# Patient Record
Sex: Male | Born: 1955 | Race: White | Hispanic: No | Marital: Married | Smoking: Former smoker
Health system: Southern US, Community
[De-identification: ages and names within clinical notes are randomized; demographics above are authoritative.]

## PROBLEM LIST (undated history)

## (undated) DIAGNOSIS — F32A Depression, unspecified: Secondary | ICD-10-CM

## (undated) DIAGNOSIS — F329 Major depressive disorder, single episode, unspecified: Secondary | ICD-10-CM

## (undated) DIAGNOSIS — Z889 Allergy status to unspecified drugs, medicaments and biological substances status: Secondary | ICD-10-CM

## (undated) DIAGNOSIS — I10 Essential (primary) hypertension: Secondary | ICD-10-CM

## (undated) DIAGNOSIS — E7800 Pure hypercholesterolemia, unspecified: Secondary | ICD-10-CM

## (undated) DIAGNOSIS — H409 Unspecified glaucoma: Secondary | ICD-10-CM

## (undated) DIAGNOSIS — G249 Dystonia, unspecified: Secondary | ICD-10-CM

## (undated) DIAGNOSIS — T7840XA Allergy, unspecified, initial encounter: Secondary | ICD-10-CM

## (undated) HISTORY — PX: COLONOSCOPY: SHX174

## (undated) HISTORY — PX: VASECTOMY: SHX75

## (undated) HISTORY — DX: Allergy, unspecified, initial encounter: T78.40XA

## (undated) HISTORY — PX: POLYPECTOMY: SHX149

## (undated) HISTORY — DX: Major depressive disorder, single episode, unspecified: F32.9

## (undated) HISTORY — DX: Dystonia, unspecified: G24.9

## (undated) HISTORY — DX: Depression, unspecified: F32.A

---

## 1998-06-02 HISTORY — PX: OTHER SURGICAL HISTORY: SHX169

## 2008-11-30 ENCOUNTER — Encounter: Admission: RE | Admit: 2008-11-30 | Discharge: 2008-11-30 | Payer: Self-pay | Admitting: Internal Medicine

## 2010-03-30 ENCOUNTER — Ambulatory Visit: Payer: Self-pay | Admitting: Diagnostic Radiology

## 2010-03-30 ENCOUNTER — Emergency Department (HOSPITAL_BASED_OUTPATIENT_CLINIC_OR_DEPARTMENT_OTHER)
Admission: EM | Admit: 2010-03-30 | Discharge: 2010-03-30 | Payer: Self-pay | Source: Home / Self Care | Admitting: Emergency Medicine

## 2014-08-30 ENCOUNTER — Other Ambulatory Visit: Payer: Self-pay | Admitting: Gastroenterology

## 2014-11-21 ENCOUNTER — Other Ambulatory Visit: Payer: Self-pay | Admitting: Gastroenterology

## 2014-11-22 ENCOUNTER — Encounter (HOSPITAL_COMMUNITY): Payer: Self-pay | Admitting: *Deleted

## 2014-11-27 ENCOUNTER — Encounter (HOSPITAL_COMMUNITY): Payer: Self-pay

## 2014-11-27 ENCOUNTER — Ambulatory Visit (HOSPITAL_COMMUNITY): Payer: No Typology Code available for payment source | Admitting: Certified Registered Nurse Anesthetist

## 2014-11-27 ENCOUNTER — Ambulatory Visit (HOSPITAL_COMMUNITY)
Admission: RE | Admit: 2014-11-27 | Discharge: 2014-11-27 | Disposition: A | Discharge status: Home / Self Care | Payer: No Typology Code available for payment source | Source: Ambulatory Visit | Attending: Gastroenterology | Admitting: Gastroenterology

## 2014-11-27 ENCOUNTER — Encounter (HOSPITAL_COMMUNITY)
Admission: RE | Disposition: A | Discharge status: Home / Self Care | Payer: Self-pay | Source: Ambulatory Visit | Attending: Gastroenterology

## 2014-11-27 DIAGNOSIS — H409 Unspecified glaucoma: Secondary | ICD-10-CM | POA: Insufficient documentation

## 2014-11-27 DIAGNOSIS — Z79899 Other long term (current) drug therapy: Secondary | ICD-10-CM | POA: Diagnosis not present

## 2014-11-27 DIAGNOSIS — K5730 Diverticulosis of large intestine without perforation or abscess without bleeding: Secondary | ICD-10-CM | POA: Insufficient documentation

## 2014-11-27 DIAGNOSIS — D123 Benign neoplasm of transverse colon: Secondary | ICD-10-CM | POA: Diagnosis not present

## 2014-11-27 DIAGNOSIS — K621 Rectal polyp: Secondary | ICD-10-CM | POA: Diagnosis not present

## 2014-11-27 DIAGNOSIS — I10 Essential (primary) hypertension: Secondary | ICD-10-CM | POA: Diagnosis not present

## 2014-11-27 DIAGNOSIS — Z1211 Encounter for screening for malignant neoplasm of colon: Secondary | ICD-10-CM | POA: Diagnosis present

## 2014-11-27 DIAGNOSIS — L930 Discoid lupus erythematosus: Secondary | ICD-10-CM | POA: Insufficient documentation

## 2014-11-27 HISTORY — DX: Pure hypercholesterolemia, unspecified: E78.00

## 2014-11-27 HISTORY — DX: Essential (primary) hypertension: I10

## 2014-11-27 HISTORY — DX: Allergy status to unspecified drugs, medicaments and biological substances: Z88.9

## 2014-11-27 HISTORY — DX: Unspecified glaucoma: H40.9

## 2014-11-27 HISTORY — PX: COLONOSCOPY WITH PROPOFOL: SHX5780

## 2014-11-27 SURGERY — COLONOSCOPY WITH PROPOFOL
Anesthesia: Monitor Anesthesia Care

## 2014-11-27 MED ORDER — PROPOFOL 500 MG/50ML IV EMUL
INTRAVENOUS | Status: DC | PRN
  Administered 2014-11-27: 50 mg via INTRAVENOUS
  Administered 2014-11-27: 30 mg via INTRAVENOUS
  Administered 2014-11-27: 40 mg via INTRAVENOUS
  Administered 2014-11-27 (×2): 50 mg via INTRAVENOUS
  Administered 2014-11-27: 40 mg via INTRAVENOUS
  Administered 2014-11-27: 100 mg via INTRAVENOUS
  Administered 2014-11-27 (×3): 30 mg via INTRAVENOUS
  Administered 2014-11-27: 50 mg via INTRAVENOUS

## 2014-11-27 MED ORDER — SODIUM CHLORIDE 0.9 % IV SOLN: INTRAVENOUS | Status: DC

## 2014-11-27 MED ORDER — PROPOFOL 10 MG/ML IV BOLUS
INTRAVENOUS | Status: AC
  Filled 2014-11-27: qty 20

## 2014-11-27 MED ORDER — LIDOCAINE HCL (CARDIAC) 20 MG/ML IV SOLN
INTRAVENOUS | Status: AC
  Filled 2014-11-27: qty 5

## 2014-11-27 MED ORDER — LIDOCAINE HCL (CARDIAC) 20 MG/ML IV SOLN
INTRAVENOUS | Status: DC | PRN
  Administered 2014-11-27 (×2): 50 mg via INTRAVENOUS

## 2014-11-27 MED ORDER — LACTATED RINGERS IV SOLN
INTRAVENOUS | Status: DC | PRN
  Administered 2014-11-27: 10:00:00 via INTRAVENOUS

## 2014-11-27 SURGICAL SUPPLY — 21 items

## 2014-11-27 NOTE — Anesthesia Preprocedure Evaluation (Signed)
Anesthesia Evaluation  Patient identified by MRN, date of birth, ID band Patient awake    Reviewed: Allergy & Precautions, H&P , NPO status , Patient's Chart, lab work & pertinent test results  Airway Mallampati: II  TM Distance: >3 FB Neck ROM: full    Dental no notable dental hx. (+) Teeth Intact, Dental Advisory Given   Pulmonary neg pulmonary ROS,  breath sounds clear to auscultation  Pulmonary exam normal       Cardiovascular Exercise Tolerance: Good hypertension, Pt. on medications Normal cardiovascular examRhythm:regular Rate:Normal     Neuro/Psych glaucoma negative neurological ROS  negative psych ROS   GI/Hepatic negative GI ROS, Neg liver ROS,   Endo/Other  negative endocrine ROS  Renal/GU negative Renal ROS  negative genitourinary   Musculoskeletal   Abdominal   Peds  Hematology negative hematology ROS (+)   Anesthesia Other Findings Lupus  Reproductive/Obstetrics negative OB ROS                             Anesthesia Physical Anesthesia Plan  ASA: III  Anesthesia Plan: MAC   Post-op Pain Management:    Induction:   Airway Management Planned:   Additional Equipment:   Intra-op Plan:   Post-operative Plan:   Informed Consent: I have reviewed the patients History and Physical, chart, labs and discussed the procedure including the risks, benefits and alternatives for the proposed anesthesia with the patient or authorized representative who has indicated his/her understanding and acceptance.   Dental Advisory Given  Plan Discussed with: CRNA and Surgeon  Anesthesia Plan Comments:         Anesthesia Quick Evaluation

## 2014-11-27 NOTE — Discharge Instructions (Signed)
Colonoscopy, Care After °Refer to this sheet in the next few weeks. These instructions provide you with information on caring for yourself after your procedure. Your health care provider may also give you more specific instructions. Your treatment has been planned according to current medical practices, but problems sometimes occur. Call your health care provider if you have any problems or questions after your procedure. °WHAT TO EXPECT AFTER THE PROCEDURE  °After your procedure, it is typical to have the following: °· A small amount of blood in your stool. °· Moderate amounts of gas and mild abdominal cramping or bloating. °HOME CARE INSTRUCTIONS °· Do not drive, operate machinery, or sign important documents for 24 hours. °· You may shower and resume your regular physical activities, but move at a slower pace for the first 24 hours. °· Take frequent rest periods for the first 24 hours. °· Walk around or put a warm pack on your abdomen to help reduce abdominal cramping and bloating. °· Drink enough fluids to keep your urine clear or pale yellow. °· You may resume your normal diet as instructed by your health care provider. Avoid heavy or fried foods that are hard to digest. °· Avoid drinking alcohol for 24 hours or as instructed by your health care provider. °· Only take over-the-counter or prescription medicines as directed by your health care provider. °· If a tissue sample (biopsy) was taken during your procedure: °¨ Do not take aspirin or blood thinners for 7 days, or as instructed by your health care provider. °¨ Do not drink alcohol for 7 days, or as instructed by your health care provider. °¨ Eat soft foods for the first 24 hours. °SEEK MEDICAL CARE IF: °You have persistent spotting of blood in your stool 2-3 days after the procedure. °SEEK IMMEDIATE MEDICAL CARE IF: °· You have more than a small spotting of blood in your stool. °· You pass large blood clots in your stool. °· Your abdomen is swollen  (distended). °· You have nausea or vomiting. °· You have a fever. °· You have increasing abdominal pain that is not relieved with medicine. °Document Released: 01/01/2004 Document Revised: 03/09/2013 Document Reviewed: 01/24/2013 °Exit[employer]® Patient Information ©2015 [employer]. This information is not intended to replace advice given to you by your health care provider. Make sure you discuss any questions you have with your health care provider. ° °Conscious Sedation, Adult, Care After °Refer to this sheet in the next few weeks. These instructions provide you with information on caring for yourself after your procedure. Your health care provider may also give you more specific instructions. Your treatment has been planned according to current medical practices, but problems sometimes occur. Call your health care provider if you have any problems or questions after your procedure. °WHAT TO EXPECT AFTER THE PROCEDURE  °After your procedure: °· You may feel sleepy, clumsy, and have poor balance for several hours. °· Vomiting may occur if you eat too soon after the procedure. °HOME CARE INSTRUCTIONS °· Do not participate in any activities where you could become injured for at least 24 hours. Do not: °¨ Drive. °¨ Swim. °¨ Ride a bicycle. °¨ Operate heavy machinery. °¨ Cook. °¨ Use power tools. °¨ Climb ladders. °¨ Work from a high place. °· Do not make important decisions or sign legal documents until you are improved. °· If you vomit, drink water, juice, or soup when you can drink without vomiting. Make sure you have little or no nausea before eating solid foods. °·   Only take over-the-counter or prescription medicines for pain, discomfort, or fever as directed by your health care provider. °· Make sure you and your family fully understand everything about the medicines given to you, including what side effects may occur. °· You should not drink alcohol, take sleeping pills, or take medicines that cause drowsiness for  at least 24 hours. °· If you smoke, do not smoke without supervision. °· If you are feeling better, you may resume normal activities 24 hours after you were sedated. °· Keep all appointments with your health care provider. °SEEK MEDICAL CARE IF: °· Your skin is pale or bluish in color. °· You continue to feel nauseous or vomit. °· Your pain is getting worse and is not helped by medicine. °· You have bleeding or swelling. °· You are still sleepy or feeling clumsy after 24 hours. °SEEK IMMEDIATE MEDICAL CARE IF: °· You develop a rash. °· You have difficulty breathing. °· You develop any type of allergic problem. °· You have a fever. °MAKE SURE YOU: °· Understand these instructions. °· Will watch your condition. °· Will get help right away if you are not doing well or get worse. °Document Released: 03/09/2013 Document Reviewed: 03/09/2013 °Exit[employer]® Patient Information ©2015 [employer]. This information is not intended to replace advice given to you by your health care provider. Make sure you discuss any questions you have with your health care provider. ° ° °

## 2014-11-27 NOTE — Op Note (Signed)
Procedure: Screening colonoscopy  Endoscopist: Earle Gell  Premedication: Propofol administered by anesthesia  Procedure: The patient was placed in the left lateral decubitus position. Anal inspection and digital rectal exam were normal. The Pentax pediatric colonoscope was introduced into the rectum and advanced to the cecum. A normal-appearing appendiceal orifice and ileocecal valve were identified. Colonic preparation for the exam today was good. Withdrawal time was 19 minutes  Rectum. From the proximal rectum, four 4 mm-5 mm sized polyps were removed with the cold snare and cold biopsy forceps. Retroflex view of the distal rectum was normal  Sigmoid colon and descending colon. Left colonic diverticulosis  Splenic flexure. Normal  Transverse colon. From the proximal transverse colon, two 5 mm sized polyps were removed with the cold snare  Hepatic flexure. A 5 mm sized polyp was removed with the hot snare  Ascending colon. Normal  Cecum and ileocecal valve. Normal.  Assessment: A small polyp was removed from the hepatic flexure, two small polyps were removed from the proximal transverse colon, and four small polyps were removed from the rectum. Otherwise normal screening colonoscopy  Recommendation: I will review the polyp pathology to determine when the patient should undergo a repeat colonoscopy

## 2014-11-27 NOTE — H&P (Signed)
  Procedure: Screening colonoscopy. 12/05/2004 normal baseline screening colonoscopy performed in [place], [place]. No family history of colon cancer.  History: The patient is a 59 year old male born December 18, 1955. He is scheduled to undergo a repeat screening colonoscopy today.  Past medical history: Hypertension. Hypercholesterolemia. Glaucoma. Anxiety with depression. Discoid lupus. Right hydrocele. Left arm surgery.  Exam: The patient is alert and lying comfortably on the endoscopy stretcher. Abdomen is soft and nontender to palpation. Cardiac exam reveals a regular rhythm. Lungs are clear to auscultation.  Plan: Proceed with repeat screening colonoscopy

## 2014-11-27 NOTE — Anesthesia Postprocedure Evaluation (Signed)
  Anesthesia Post-op Note  Patient: Nathaniel Proctor  Procedure(s) Performed: Procedure(s) (LRB): COLONOSCOPY WITH PROPOFOL (N/A)  Patient Location: PACU  Anesthesia Type: MAC  Level of Consciousness: awake and alert   Airway and Oxygen Therapy: Patient Spontanous Breathing  Post-op Pain: mild  Post-op Assessment: Post-op Vital signs reviewed, Patient's Cardiovascular Status Stable, Respiratory Function Stable, Patent Airway and No signs of Nausea or vomiting  Last Vitals:  Filed Vitals:   11/27/14 1110  BP: 178/85  Pulse: 55  Temp:   Resp: 12    Post-op Vital Signs: stable   Complications: No apparent anesthesia complications

## 2014-11-27 NOTE — Transfer of Care (Signed)
Immediate Anesthesia Transfer of Care Note  Patient: Nathaniel Proctor  Procedure(s) Performed: Procedure(s): COLONOSCOPY WITH PROPOFOL (N/A)  Patient Location: PACU  Anesthesia Type:MAC  Level of Consciousness: awake, alert  and oriented  Airway & Oxygen Therapy: Patient Spontanous Breathing and Patient connected to face mask oxygen  Post-op Assessment: Report given to RN and Post -op Vital signs reviewed and stable  Post vital signs: Reviewed and stable  Last Vitals:  Filed Vitals:   11/27/14 0933  BP: 164/91  Pulse: 69  Temp: 36.7 C  Resp: 16    Complications: No apparent anesthesia complications

## 2014-11-28 ENCOUNTER — Encounter (HOSPITAL_COMMUNITY): Payer: Self-pay | Admitting: Gastroenterology

## 2015-04-18 ENCOUNTER — Ambulatory Visit
Admission: RE | Admit: 2015-04-18 | Discharge: 2015-04-18 | Disposition: A | Discharge status: Home / Self Care | Payer: No Typology Code available for payment source | Source: Ambulatory Visit | Attending: Internal Medicine | Admitting: Internal Medicine

## 2015-04-18 ENCOUNTER — Other Ambulatory Visit: Payer: Self-pay | Admitting: Internal Medicine

## 2015-04-18 DIAGNOSIS — I158 Other secondary hypertension: Secondary | ICD-10-CM

## 2017-01-20 ENCOUNTER — Other Ambulatory Visit: Payer: Self-pay | Admitting: Internal Medicine

## 2017-01-20 DIAGNOSIS — I6522 Occlusion and stenosis of left carotid artery: Secondary | ICD-10-CM

## 2017-01-23 ENCOUNTER — Ambulatory Visit
Admission: RE | Admit: 2017-01-23 | Discharge: 2017-01-23 | Disposition: A | Discharge status: Home / Self Care | Payer: No Typology Code available for payment source | Source: Ambulatory Visit | Attending: Internal Medicine | Admitting: Internal Medicine

## 2017-01-23 DIAGNOSIS — I6522 Occlusion and stenosis of left carotid artery: Secondary | ICD-10-CM

## 2017-01-28 ENCOUNTER — Other Ambulatory Visit: Payer: Self-pay

## 2017-01-28 DIAGNOSIS — I6529 Occlusion and stenosis of unspecified carotid artery: Secondary | ICD-10-CM

## 2017-02-06 ENCOUNTER — Ambulatory Visit (INDEPENDENT_AMBULATORY_CARE_PROVIDER_SITE_OTHER): Payer: No Typology Code available for payment source | Admitting: Vascular Surgery

## 2017-02-06 ENCOUNTER — Ambulatory Visit (HOSPITAL_COMMUNITY)
Admission: RE | Admit: 2017-02-06 | Discharge: 2017-02-06 | Disposition: A | Discharge status: Home / Self Care | Payer: No Typology Code available for payment source | Source: Ambulatory Visit | Attending: Vascular Surgery | Admitting: Vascular Surgery

## 2017-02-06 VITALS — BP 177/102 | HR 84 | Ht 71.0 in | Wt 246.4 lb

## 2017-02-06 DIAGNOSIS — I6529 Occlusion and stenosis of unspecified carotid artery: Secondary | ICD-10-CM

## 2017-02-06 DIAGNOSIS — I6522 Occlusion and stenosis of left carotid artery: Secondary | ICD-10-CM | POA: Diagnosis not present

## 2017-02-06 LAB — VAS US CAROTID
RCCADSYS: -114 cm/s
RCCAPDIAS: 15 cm/s
RIGHT CCA MID DIAS: -18 cm/s
RIGHT ECA DIAS: -11 cm/s
RIGHT VERTEBRAL DIAS: 7 cm/s
Right CCA prox sys: 66 cm/s

## 2017-02-06 NOTE — Progress Notes (Signed)
Patient ID: Nathaniel Proctor, male   DOB: Oct 13, 1955, 61 y.o.   MRN: [id number]  Reason for Consult: No chief complaint on file.   Referred by Wenda Low, MD  Subjective:     HPI:  Nathaniel Proctor is a 61 y.o. male with past history of hypercholesterol and hypertension. He does not of stroke TIA or amaurosis. He is also not of coronary artery disease. He does take aspirin daily and was recently started on statin as well. He has been followed with carotid Dopplers now demonstrated greater than 80% stenosis. He is here for evaluation today. He has no limitations to his walking or chest pain with any activity. He does remain in her activity travels often.  Past Medical History:  Diagnosis Date  . Glaucoma   . Hx of seasonal allergies   . Hypercholesterolemia   . Hypertension    No family history on file. Past Surgical History:  Procedure Laterality Date  . COLONOSCOPY WITH PROPOFOL N/A 11/27/2014   Procedure: COLONOSCOPY WITH PROPOFOL;  Surgeon: Garlan Fair, MD;  Location: [facility];  Service: Endoscopy;  Laterality: N/A;  . VASECTOMY      Short Social History:  Social History  Substance Use Topics  . Smoking status: Never Smoker  . Smokeless tobacco: Not on file  . Alcohol use Yes     Comment: wine  occ.    No Known Allergies  Current Outpatient Prescriptions  Medication Sig Dispense Refill  . amLODipine-benazepril (LOTREL) 10-20 MG per capsule Take 1 capsule by mouth daily.    . fenofibrate (TRICOR) 145 MG tablet Take 145 mg by mouth daily.    Marland Kitchen latanoprost (XALATAN) 0.005 % ophthalmic solution Place 1 drop into both eyes at bedtime.    . metoprolol succinate (TOPROL-XL) 100 MG 24 hr tablet Take 100 mg by mouth daily. Take with or immediately following a meal.    . loratadine (CLARITIN) 10 MG tablet Take 10 mg by mouth daily.     No current facility-administered medications for this visit.     Review of Systems  Constitutional:  Constitutional negative. HENT:  HENT negative.  Eyes: Eyes negative.  Respiratory: Respiratory negative.  Cardiovascular: Cardiovascular negative.  GI: Gastrointestinal negative.  Musculoskeletal: Musculoskeletal negative.  Skin: Skin negative.  Neurological: Neurological negative. Hematologic: Hematologic/lymphatic negative.  Psychiatric: Psychiatric negative.        Objective:  Objective   Vitals:   02/06/17 1328 02/06/17 1332  BP: (!) 168/104 (!) 177/102  Pulse: 90 84  SpO2: 96%   Weight: 246 lb 6.4 oz (111.8 kg)   Height: 5\' 11"  (1.803 m)    Body mass index is 34.37 kg/m.  Physical Exam  Constitutional: He is oriented to person, place, and time. He appears well-developed.  HENT:  Head: Normocephalic.  Eyes: Pupils are equal, round, and reactive to light.  Neck: Normal range of motion.  Cardiovascular: Normal rate.   Pulses:      Radial pulses are 2+ on the right side, and 2+ on the left side.       Femoral pulses are 2+ on the right side, and 2+ on the left side.      Popliteal pulses are 2+ on the right side, and 2+ on the left side.  Pulmonary/Chest: Effort normal.  Abdominal: Soft.  Musculoskeletal: Normal range of motion. He exhibits no edema.  Neurological: He is alert and oriented to person, place, and time.  Skin: Skin is warm and dry.  Psychiatric: He has a  normal mood and affect. His behavior is normal. Judgment and thought content normal.    Data:   I have independently interpreted his carotid duplex demonstrates 80-99% stenosis on the left with peak site [id number] and end-diastolic velocity 670. The bifurcation is quite high although there does appear to be normal artery past.   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 Given that it is quite high and that he does have a very large neck I recommended CT angiogram for further evaluation. After this study we can discuss carotid endarterectomy versus stenting  that would either be with transbronchial transfemoral route. He demonstrates good understanding. We discussed signs and symptoms of stroke TIA and if he has these he will seek urgent medical care. He will continue aspirin and statin and a 4. placed and he will need Plavix first.     [name] MD [facility]

## 2017-02-13 NOTE — Addendum Note (Signed)
Addended by: Lianne Cure A on: 02/13/2017 03:53 PM   Modules accepted: Orders

## 2017-02-16 ENCOUNTER — Other Ambulatory Visit: Payer: Self-pay | Admitting: Vascular Surgery

## 2017-02-18 ENCOUNTER — Ambulatory Visit
Admission: RE | Admit: 2017-02-18 | Discharge: 2017-02-18 | Disposition: A | Discharge status: Home / Self Care | Payer: No Typology Code available for payment source | Source: Ambulatory Visit | Attending: Vascular Surgery | Admitting: Vascular Surgery

## 2017-02-18 DIAGNOSIS — I6522 Occlusion and stenosis of left carotid artery: Secondary | ICD-10-CM

## 2017-02-18 MED ORDER — IOPAMIDOL (ISOVUE-370) INJECTION 76%
75.0000 mL | Freq: Once | INTRAVENOUS | Status: AC | PRN
  Administered 2017-02-18: 75 mL via INTRAVENOUS

## 2017-02-20 ENCOUNTER — Encounter: Payer: No Typology Code available for payment source | Admitting: Vascular Surgery

## 2017-02-20 ENCOUNTER — Encounter: Payer: Self-pay | Admitting: Vascular Surgery

## 2017-02-20 ENCOUNTER — Ambulatory Visit: Payer: No Typology Code available for payment source | Admitting: Vascular Surgery

## 2017-02-20 ENCOUNTER — Encounter (HOSPITAL_COMMUNITY): Payer: No Typology Code available for payment source

## 2017-02-20 ENCOUNTER — Ambulatory Visit (INDEPENDENT_AMBULATORY_CARE_PROVIDER_SITE_OTHER): Payer: No Typology Code available for payment source | Admitting: Vascular Surgery

## 2017-02-20 VITALS — BP 158/93 | HR 66 | Temp 97.4°F | Resp 18 | Ht 71.0 in | Wt 245.0 lb

## 2017-02-20 DIAGNOSIS — I6522 Occlusion and stenosis of left carotid artery: Secondary | ICD-10-CM | POA: Diagnosis not present

## 2017-02-20 NOTE — Progress Notes (Signed)
Patient ID: Nathaniel Proctor, male   DOB: [birth year], 61 y.o.   MRN: [id number]  Reason for Consult: Carotid (2-3 week f/u CTA Head/Neck)   Referred by Wenda Low, MD  Subjective:     HPI:  Nathaniel Proctor is a 61 y.o. male initially presented for asymptomatic carotid stenosis earlier this month. He is a former smoker has a history of hypercholesterolemia and hypertension. He is now taking aspirin and statin as directed. He remains asymptomatic having never had TIA stroke or amaurosis. CT scan performed prior to today's visit.  Past Medical History:  Diagnosis Date  . Glaucoma   . Hx of seasonal allergies   . Hypercholesterolemia   . Hypertension    History reviewed. No pertinent family history. Past Surgical History:  Procedure Laterality Date  . COLONOSCOPY WITH PROPOFOL N/A 11/27/2014   Procedure: COLONOSCOPY WITH PROPOFOL;  Surgeon: Garlan Fair, MD;  Location: [facility];  Service: Endoscopy;  Laterality: N/A;  . VASECTOMY      Short Social History:  Social History  Substance Use Topics  . Smoking status: Former Smoker    Types: Cigarettes, Cigars    Quit date: 02/21/2011  . Smokeless tobacco: Never Used  . Alcohol use Yes     Comment: wine  occ.    No Known Allergies  Current Outpatient Prescriptions  Medication Sig Dispense Refill  . amLODipine-benazepril (LOTREL) 10-20 MG per capsule Take 1 capsule by mouth daily.    Marland Kitchen atorvastatin (LIPITOR) 10 MG tablet     . fenofibrate (TRICOR) 145 MG tablet Take 145 mg by mouth daily.    Marland Kitchen latanoprost (XALATAN) 0.005 % ophthalmic solution Place 1 drop into both eyes at bedtime.    Marland Kitchen loratadine (CLARITIN) 10 MG tablet Take 10 mg by mouth daily.    . metoprolol succinate (TOPROL-XL) 100 MG 24 hr tablet Take 100 mg by mouth daily. Take with or immediately following a meal.     No current facility-administered medications for this visit.     Review of Systems  Constitutional:  Constitutional negative. HENT: HENT  negative.  Eyes: Eyes negative.  Respiratory: Respiratory negative.  Cardiovascular: Cardiovascular negative.  GI: Gastrointestinal negative.  Musculoskeletal: Musculoskeletal negative.  Skin: Skin negative.  Neurological: Neurological negative. Hematologic: Hematologic/lymphatic negative.        Objective:  Objective   Vitals:   02/20/17 0923 02/20/17 0925  BP: (!) 166/95 (!) 158/93  Pulse: 66   Resp: 18   Temp: (!) 97.4 F (36.3 C)   TempSrc: Oral   SpO2: 95%   Weight: 245 lb (111.1 kg)   Height: 5\' 11"  (1.803 m)    Body mass index is 34.17 kg/m.  Physical Exam  Constitutional: He is oriented to person, place, and time. He appears well-developed.  HENT:  Head: Normocephalic.  Eyes: Pupils are equal, round, and reactive to light.  Neck: Normal range of motion.  Cardiovascular: Normal rate.   Pulmonary/Chest: Effort normal.  Abdominal: Soft. He exhibits no mass.  Musculoskeletal: Normal range of motion. He exhibits no edema.  Neurological: He is alert and oriented to person, place, and time. No cranial nerve deficit.  Skin: Skin is warm and dry.  Psychiatric: He has a normal mood and affect. His behavior is normal. Judgment and thought content normal.    Data: IMPRESSION: 1. Left carotid bifurcation and proximal left ICA atherosclerosis with predominantly soft plaque. Midpole left ICA stenosis numerically estimated at 65-70%. Superimposed mild stenosis of the distal left ICA in  the supraclinoid segment. 2. No significant right carotid or right vertebral artery stenosis. 3. 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 Moderate stenosis of the distal left vertebral artery just proximal to the vertebrobasilar junction. 4. Negative anterior and posterior circulation branches. 5.  Normal for age CT appearance of the brain. 6.  Emphysema (ICD10-J43.9).     Assessment/Plan:     61 year old male with asymptomatic carotid artery stenosis previous duplex  that demonstrated 80-99% stenosis. He now has CT scan demonstrating less than 70% stenosis with homogeneous plaque in he remains free of symptoms. We discussed possible options including watchful waiting with aspirin and statin therapy versus carotid endarterectomy versus stenting. This time we will pursue watchful waiting given that this is really on the low and of high-grade stenosis and CT scan she demonstrates what appears to be very smooth plaque at less than 70% stenosis. We will get a repeat CT angiogram in 6 months and he will follow-up then. Certainly if he has symptoms which we discussed today he will need to be repaired at that time. He demonstrates good understanding we will see him in 6 months.     [name] MD [facility]

## 2017-02-26 NOTE — Addendum Note (Signed)
Addended by: Lianne Cure A on: 02/26/2017 04:33 PM   Modules accepted: Orders

## 2017-06-08 ENCOUNTER — Emergency Department (HOSPITAL_COMMUNITY)
Admission: EM | Admit: 2017-06-08 | Discharge: 2017-06-08 | Disposition: A | Discharge status: Home / Self Care | Payer: No Typology Code available for payment source | Attending: Emergency Medicine | Admitting: Emergency Medicine

## 2017-06-08 ENCOUNTER — Emergency Department (HOSPITAL_COMMUNITY): Payer: No Typology Code available for payment source

## 2017-06-08 ENCOUNTER — Encounter (HOSPITAL_COMMUNITY): Payer: Self-pay

## 2017-06-08 ENCOUNTER — Other Ambulatory Visit: Payer: Self-pay

## 2017-06-08 DIAGNOSIS — R531 Weakness: Secondary | ICD-10-CM | POA: Diagnosis present

## 2017-06-08 DIAGNOSIS — M79602 Pain in left arm: Secondary | ICD-10-CM | POA: Diagnosis not present

## 2017-06-08 DIAGNOSIS — Z79899 Other long term (current) drug therapy: Secondary | ICD-10-CM | POA: Diagnosis not present

## 2017-06-08 DIAGNOSIS — I10 Essential (primary) hypertension: Secondary | ICD-10-CM | POA: Insufficient documentation

## 2017-06-08 DIAGNOSIS — Z7982 Long term (current) use of aspirin: Secondary | ICD-10-CM | POA: Diagnosis not present

## 2017-06-08 DIAGNOSIS — Z87891 Personal history of nicotine dependence: Secondary | ICD-10-CM | POA: Diagnosis not present

## 2017-06-08 LAB — APTT: APTT: 27 s (ref 24–36)

## 2017-06-08 LAB — DIFFERENTIAL
BASOS ABS: 0 10*3/uL (ref 0.0–0.1)
Basophils Relative: 0 %
Eosinophils Absolute: 0.1 10*3/uL (ref 0.0–0.7)
Eosinophils Relative: 1 %
LYMPHS ABS: 2.3 10*3/uL (ref 0.7–4.0)
LYMPHS PCT: 27 %
Monocytes Absolute: 0.7 10*3/uL (ref 0.1–1.0)
Monocytes Relative: 9 %
NEUTROS ABS: 5.2 10*3/uL (ref 1.7–7.7)
NEUTROS PCT: 63 %

## 2017-06-08 LAB — CBC
HCT: 46.8 % (ref 39.0–52.0)
Hemoglobin: 15.9 g/dL (ref 13.0–17.0)
MCH: 31.8 pg (ref 26.0–34.0)
MCHC: 34 g/dL (ref 30.0–36.0)
MCV: 93.6 fL (ref 78.0–100.0)
Platelets: 238 10*3/uL (ref 150–400)
RBC: 5 MIL/uL (ref 4.22–5.81)
RDW: 12.4 % (ref 11.5–15.5)
WBC: 8.3 10*3/uL (ref 4.0–10.5)

## 2017-06-08 LAB — I-STAT CHEM 8, ED
BUN: 12 mg/dL (ref 6–20)
CALCIUM ION: 1.13 mmol/L — AB (ref 1.15–1.40)
CHLORIDE: 101 mmol/L (ref 101–111)
CREATININE: 0.9 mg/dL (ref 0.61–1.24)
Glucose, Bld: 101 mg/dL — ABNORMAL HIGH (ref 65–99)
HEMATOCRIT: 47 % (ref 39.0–52.0)
Hemoglobin: 16 g/dL (ref 13.0–17.0)
Potassium: 3.6 mmol/L (ref 3.5–5.1)
Sodium: 141 mmol/L (ref 135–145)
TCO2: 28 mmol/L (ref 22–32)

## 2017-06-08 LAB — PROTIME-INR
INR: 1.01
Prothrombin Time: 13.2 seconds (ref 11.4–15.2)

## 2017-06-08 LAB — COMPREHENSIVE METABOLIC PANEL
ALBUMIN: 4.2 g/dL (ref 3.5–5.0)
ALK PHOS: 65 U/L (ref 38–126)
ALT: 43 U/L (ref 17–63)
AST: 41 U/L (ref 15–41)
Anion gap: 12 (ref 5–15)
BILIRUBIN TOTAL: 1 mg/dL (ref 0.3–1.2)
BUN: 11 mg/dL (ref 6–20)
CALCIUM: 9.2 mg/dL (ref 8.9–10.3)
CO2: 22 mmol/L (ref 22–32)
Chloride: 103 mmol/L (ref 101–111)
Creatinine, Ser: 0.89 mg/dL (ref 0.61–1.24)
GFR calc Af Amer: >60 mL/min (ref >60)
GFR calc non Af Amer: >60 mL/min (ref >60)
GLUCOSE: 100 mg/dL — AB (ref 65–99)
Potassium: 3.5 mmol/L (ref 3.5–5.1)
Sodium: 137 mmol/L (ref 135–145)
TOTAL PROTEIN: 7.8 g/dL (ref 6.5–8.1)

## 2017-06-08 LAB — I-STAT TROPONIN, ED: Troponin i, poc: 0 ng/mL (ref 0.00–0.08)

## 2017-06-08 NOTE — ED Triage Notes (Signed)
Pt sent from Dr. Colletta [place] office d/t left arm pain, weakness, and drift, aphasia at times, and left leg weakness and pain x ~2 weeks. Pt has hx of 80-90% blockage in left carotid artery.

## 2017-06-08 NOTE — Discharge Instructions (Signed)
As discussed, your evaluation today has been largely reassuring.  But, it is important that you monitor your condition carefully, and do not hesitate to return to the ED if you develop new, or concerning changes in your condition. ? ?Otherwise, please follow-up with your physician for appropriate ongoing care. ? ?

## 2017-06-09 ENCOUNTER — Other Ambulatory Visit: Payer: Self-pay

## 2017-06-09 DIAGNOSIS — I6529 Occlusion and stenosis of unspecified carotid artery: Secondary | ICD-10-CM

## 2017-06-09 NOTE — ED Provider Notes (Signed)
[facility] DEPART[facility] Provider Note   CSN: [id number] Arrival date & time: 06/08/17  1422     History   Chief Complaint Chief Complaint  Patient presents with  . Stroke Symptoms    HPI Nathaniel Proctor is a 62 y.o. male.  HPI Presents with concern of ongoing left-sided weakness.  He has left upper arm weakness, particularly with exertion, or with elevation. Patient also was possibly witnessed by his wife to have change in speech earlier today. Review notable changes given the patient has previously identified left carotid artery stenosis, has been seen by vascular surgery, and is currently being evaluated for intervention. With these new complaints, particularly worsening weakness today, he was sent here for evaluation after contacting the office. He denies other weakness, speech difficulty that he perceives, vision loss, confusion, disorientation or pain anywhere. He states that he takes all medication as directed, and there are no clear alleviating or exacerbating factors.   Past Medical History:  Diagnosis Date  . Glaucoma   . Hx of seasonal allergies   . Hypercholesterolemia   . Hypertension     There are no active problems to display for this patient.   Past Surgical History:  Procedure Laterality Date  . COLONOSCOPY WITH PROPOFOL N/A 11/27/2014   Procedure: COLONOSCOPY WITH PROPOFOL;  Surgeon: Garlan Fair, MD;  Location: [facility];  Service: Endoscopy;  Laterality: N/A;  . VASECTOMY         Home Medications    Prior to Admission medications   Medication Sig Start Date End Date Taking? Authorizing Provider  amLODipine-benazepril (LOTREL) 10-20 MG per capsule Take 1 capsule by mouth daily.   Yes [provider]  aspirin EC 81 MG tablet Take 81 mg by mouth daily.   Yes [provider]  atorvastatin (LIPITOR) 10 MG tablet Take 10 mg by mouth daily at 6 PM.  02/17/17  Yes [provider]  fenofibrate  (TRICOR) 145 MG tablet Take 145 mg by mouth daily.   Yes [provider]  latanoprost (XALATAN) 0.005 % ophthalmic solution Place 1 drop into both eyes at bedtime.   Yes [provider]  loratadine (CLARITIN) 10 MG tablet Take 10 mg by mouth daily as needed for allergies.    Yes [provider]  metoprolol succinate (TOPROL-XL) 100 MG 24 hr tablet Take 100 mg by mouth daily. Take with or immediately following a meal.   Yes [provider]    Family History No family history on file.  Social History Social History   Tobacco Use  . Smoking status: Former Smoker    Types: Cigarettes, Cigars    Last attempt to quit: 02/21/2011    Years since quitting: 6.3  . Smokeless tobacco: Never Used  Substance Use Topics  . Alcohol use: Yes    Comment: wine  occ.  . Drug use: No     Allergies   Patient has no known allergies.   Review of Systems Review of Systems  Constitutional:       Per HPI, otherwise negative  HENT:       Per HPI, otherwise negative  Respiratory:       Per HPI, otherwise negative  Cardiovascular:       Per HPI, otherwise negative  Gastrointestinal: Negative for vomiting.  Endocrine:       Negative aside from HPI  Genitourinary:       Neg aside from HPI   Musculoskeletal:  Per HPI, otherwise negative  Skin: Negative.   Neurological: Positive for weakness and numbness. Negative for syncope.     Physical Exam Updated Vital Signs BP (!) 143/81   Pulse 76   Temp 98.6 F (37 C)   Resp (!) 23   Wt 111.1 kg (245 lb)   SpO2 92%   BMI 34.17 kg/m   Physical Exam  Constitutional: He is oriented to person, place, and time. He appears well-developed. No distress.  HENT:  Head: Normocephalic and atraumatic.  Eyes: Conjunctivae and EOM are normal.  Cardiovascular: Normal rate and regular rhythm.  Pulmonary/Chest: Effort normal. No stridor. No respiratory distress.  Abdominal: He exhibits no distension.    Musculoskeletal: He exhibits no edema.  Neurological: He is alert and oriented to person, place, and time.  Though the patient describes weakness in his left upper extremity, strength exam in both upper extremities is symmetric 5/5 proximal and distal. No facial asymmetry, no speech deficits.  Skin: Skin is warm and dry.  Psychiatric: He has a normal mood and affect.  Nursing note and vitals reviewed.    ED Treatments / Results  Labs (all labs ordered are listed, but only abnormal results are displayed) Labs Reviewed  COMPREHENSIVE METABOLIC PANEL - Abnormal; Notable for the following components:      Result Value   Glucose, Bld 100 (*)    All other components within normal limits  I-STAT CHEM 8, ED - Abnormal; Notable for the following components:   Glucose, Bld 101 (*)    Calcium, Ion 1.13 (*)    All other components within normal limits  PROTIME-INR  APTT  CBC  DIFFERENTIAL  I-STAT TROPONIN, ED  CBG MONITORING, ED     Radiology Ct Head Wo Contrast  Result Date: 06/08/2017 CLINICAL DATA:  Left-sided weakness and slurred speech. Known left carotid blockage. EXAM: CT HEAD WITHOUT CONTRAST TECHNIQUE: Contiguous axial images were obtained from the base of the skull through the vertex without intravenous contrast. COMPARISON:  CT 02/18/2017. FINDINGS: Brain: There is no evidence of acute intracranial hemorrhage, mass lesion, brain edema or extra-axial fluid collection. There is mild generalized atrophy with prominence of the ventricles and subarachnoid spaces. There is no CT evidence of acute cortical infarction. Vascular: Intracranial vascular calcifications. No hyperdense vessel identified. Skull: Negative for fracture or focal lesion. Sinuses/Orbits: The visualized paranasal sinuses and mastoid air cells are clear. No orbital abnormalities are seen. Other: None. IMPRESSION: Stable head CT.  No acute intracranial findings. Electronically Signed   By: Richardean Sale M.D.   On:  06/08/2017 17:04    Procedures Procedures (including critical care time)  Medications Ordered in ED Medications - No data to display   Initial Impression / Assessment and Plan / ED Course  I have reviewed the triage vital signs and the nursing notes.  Pertinent labs & imaging results that were available during my care of the patient were reviewed by me and considered in my medical decision making (see chart for details).  After initial evaluation, and return of labs I discussed the patient's case with our vascular surgery colleagues. Given the reassuring physical exam, and as previously demonstrated CT scan, I had already discussed the possibility of additional imaging tonight with the patient and his wife, who defer the study, preferring outpatient follow-up with vascular surgery. On my discussion with her colleague, this was facilitated, and given the absence of objective neurologic findings, low suspicion for a new phenomena such as stroke, and access to close  outpatient follow-up, the patient was discharged in stable condition.  Final Clinical Impressions(s) / ED Diagnoses   Final diagnoses:  Left-sided weakness      Carmin Muskrat, MD 06/10/17 [phone]

## 2017-06-10 ENCOUNTER — Ambulatory Visit (HOSPITAL_COMMUNITY)
Admission: RE | Admit: 2017-06-10 | Discharge: 2017-06-10 | Disposition: A | Discharge status: Home / Self Care | Payer: No Typology Code available for payment source | Source: Ambulatory Visit | Attending: Vascular Surgery | Admitting: Vascular Surgery

## 2017-06-10 DIAGNOSIS — I6523 Occlusion and stenosis of bilateral carotid arteries: Secondary | ICD-10-CM | POA: Diagnosis not present

## 2017-06-10 DIAGNOSIS — I6529 Occlusion and stenosis of unspecified carotid artery: Secondary | ICD-10-CM

## 2017-06-12 ENCOUNTER — Ambulatory Visit (INDEPENDENT_AMBULATORY_CARE_PROVIDER_SITE_OTHER): Payer: No Typology Code available for payment source | Admitting: Vascular Surgery

## 2017-06-12 VITALS — BP 171/86 | HR 66 | Temp 97.6°F | Resp 20 | Ht 71.0 in | Wt 247.7 lb

## 2017-06-12 DIAGNOSIS — I6522 Occlusion and stenosis of left carotid artery: Secondary | ICD-10-CM | POA: Diagnosis not present

## 2017-06-12 NOTE — Progress Notes (Signed)
Patient ID: Nathaniel Proctor, male   DOB: 01-14-1956, 62 y.o.   MRN: [id number]  Reason for Consult: Follow-up   Referred by Wenda Low, MD  Subjective:     HPI:  Nathaniel Proctor is a 62 y.o. male with a history of high-grade left carotid artery stenosis and we discussed back in September carotid endarterectomy versus stenting versus nothing.  He recently presented to the emergency department with concern for slurring of words left shoulder pain as well as numbness in his left foot.  Shoulder pain he thinks is from yoga and this has persisted.  The numbness in the left foot has been a recurring thing.  The slurring of his words was really not being able to find the correct words or using inappropriate words and this has happened several times on an intermittent basis over the course of months.  He has no residual focal deficits and recently was in the emergency department for the symptoms were thought not related to carotid stenosis or stroke or TIA.  He denies any history of amaurosis.  He is taking his aspirin and statin drug as recommended.  Past Medical History:  Diagnosis Date  . Glaucoma   . Hx of seasonal allergies   . Hypercholesterolemia   . Hypertension    No family history on file. Past Surgical History:  Procedure Laterality Date  . COLONOSCOPY WITH PROPOFOL N/A 11/27/2014   Procedure: COLONOSCOPY WITH PROPOFOL;  Surgeon: Garlan Fair, MD;  Location: [facility];  Service: Endoscopy;  Laterality: N/A;  . VASECTOMY      Short Social History:  Social History   Tobacco Use  . Smoking status: Former Smoker    Types: Cigarettes, Cigars    Last attempt to quit: 02/21/2011    Years since quitting: 6.3  . Smokeless tobacco: Never Used  Substance Use Topics  . Alcohol use: Yes    Comment: wine  occ.    No Known Allergies  Current Outpatient Medications  Medication Sig Dispense Refill  . amLODipine-benazepril (LOTREL) 10-20 MG per capsule Take 1 capsule by mouth  daily.    Marland Kitchen aspirin EC 81 MG tablet Take 81 mg by mouth daily.    Marland Kitchen atorvastatin (LIPITOR) 10 MG tablet Take 10 mg by mouth daily at 6 PM.     . fenofibrate (TRICOR) 145 MG tablet Take 145 mg by mouth daily.    Marland Kitchen latanoprost (XALATAN) 0.005 % ophthalmic solution Place 1 drop into both eyes at bedtime.    Marland Kitchen loratadine (CLARITIN) 10 MG tablet Take 10 mg by mouth daily as needed for allergies.     . metoprolol succinate (TOPROL-XL) 100 MG 24 hr tablet Take 100 mg by mouth daily. Take with or immediately following a meal.     No current facility-administered medications for this visit.     Review of Systems  Constitutional:  Constitutional negative. HENT: HENT negative.  Eyes: Eyes negative.  Cardiovascular: Cardiovascular negative.  GI: Gastrointestinal negative.  Musculoskeletal: Positive for joint pain and myalgias.  Neurological: Positive for numbness.  Hematologic: Hematologic/lymphatic negative.  Psychiatric: Psychiatric negative.        Objective:  Objective   Vitals:   06/12/17 0950 06/12/17 0957  BP: (!) 164/95 (!) 171/86  Pulse: 66   Resp: 20   Temp: 97.6 F (36.4 C)   TempSrc: Oral   SpO2: 98%   Weight: 247 lb 11.2 oz (112.4 kg)   Height: 5\' 11"  (1.803 m)    Body mass index  is 34.55 kg/m.  Physical Exam  Constitutional: He is oriented to person, place, and time. He appears well-developed.  HENT:  Head: Normocephalic.  Eyes: Pupils are equal, round, and reactive to light.  Neck: Normal range of motion.  Cardiovascular: Normal rate.  Pulses:      Radial pulses are 2+ on the right side, and 2+ on the left side.       Popliteal pulses are 2+ on the right side, and 2+ on the left side.  Pulmonary/Chest: Effort normal.  Abdominal: Soft.  Musculoskeletal: Normal range of motion. He exhibits no edema.  Neurological: He is alert and oriented to person, place, and time.  Skin: Skin is warm and dry.  Psychiatric: He has a normal mood and affect. His behavior is  normal. Judgment and thought content normal.    Data: I reviewed his carotid duplex which demonstrates right 1-39% stenosis and left 60-79% left ICA stenosis with peak systolic velocity of 664 heterogeneous with end-diastolic velocity of 71.   CT Head IMPRESSION: Stable head CT.  No acute intracranial findings.      Assessment/Plan:   62 year old male presents with concern for left shoulder pain and left foot numbness after we discussed carotid surgery back in September.  His carotid duplex the other day now demonstrates less than 80% stenosis that was demonstrated in the past this is more consistent with his CT angios that he had performed at the same time.  Lesion is remarkable as high although this may be the size of his neck is does not appear high on CT angios.  His symptoms are not consistent with left-sided carotid stenosis particularly given that the plaque appears to be focal and homogeneous and that his symptoms are left-sided with left-sided carotid stenosis.  I have offered to send him to a shoulder specialist for his shoulder injury but he agrees that he will rested and then discussed with his primary care doctor.  We will then see him in another year with repeat carotid duplex should he not have symptoms before that.  If he does have persistent symptoms we may have him seen by neurology and possibly discuss carotid endarterectomy although I think this is low likelihood.  We did discuss the signs and symptoms of stroke TIA and amaurosis and he demonstrates good understanding.     [name] MD [facility]

## 2017-06-15 NOTE — Addendum Note (Signed)
Addended by: Lianne Cure A on: 06/15/2017 02:22 PM   Modules accepted: Orders

## 2017-08-21 ENCOUNTER — Ambulatory Visit: Payer: No Typology Code available for payment source | Admitting: Vascular Surgery

## 2017-10-12 ENCOUNTER — Ambulatory Visit: Payer: No Typology Code available for payment source | Admitting: Family Medicine

## 2017-10-12 ENCOUNTER — Encounter: Payer: Self-pay | Admitting: Family Medicine

## 2017-10-12 VITALS — BP 134/80 | HR 68 | Ht 71.0 in | Wt 249.0 lb

## 2017-10-12 DIAGNOSIS — Z789 Other specified health status: Secondary | ICD-10-CM | POA: Diagnosis not present

## 2017-10-12 DIAGNOSIS — Z7289 Other problems related to lifestyle: Secondary | ICD-10-CM

## 2017-10-12 DIAGNOSIS — L930 Discoid lupus erythematosus: Secondary | ICD-10-CM | POA: Insufficient documentation

## 2017-10-12 DIAGNOSIS — H409 Unspecified glaucoma: Secondary | ICD-10-CM | POA: Diagnosis not present

## 2017-10-12 DIAGNOSIS — I10 Essential (primary) hypertension: Secondary | ICD-10-CM

## 2017-10-12 DIAGNOSIS — M79602 Pain in left arm: Secondary | ICD-10-CM | POA: Diagnosis not present

## 2017-10-12 DIAGNOSIS — E782 Mixed hyperlipidemia: Secondary | ICD-10-CM | POA: Diagnosis not present

## 2017-10-12 DIAGNOSIS — I6522 Occlusion and stenosis of left carotid artery: Secondary | ICD-10-CM | POA: Diagnosis not present

## 2017-10-12 DIAGNOSIS — E119 Type 2 diabetes mellitus without complications: Secondary | ICD-10-CM | POA: Insufficient documentation

## 2017-10-12 DIAGNOSIS — F1090 Alcohol use, unspecified, uncomplicated: Secondary | ICD-10-CM | POA: Insufficient documentation

## 2017-10-12 MED ORDER — METFORMIN HCL ER 500 MG PO TB24: 500.0000 mg | ORAL_TABLET | Freq: Every day | ORAL | 1 refills | Status: DC

## 2017-10-12 NOTE — Progress Notes (Signed)
Subjective:  Patient ID: Nathaniel Proctor, male    DOB: 05-10-1956  Age: 62 y.o. MRN: [id number]  CC: Establish Care   HPI [name] presents for establishment of care and follow-up of his multiple medical issues.  His blood pressure has been well controlled on his current regimen of Lotrel and metoprolol.  He has a past medical history of elevated triglycerides with a more recent diagnosis of left carotid artery stenosis.  He is followed by a vascular surgeon for this who recently started him on low-dose Lipitor.  LDL cholesterol measured in December of this past year was 60.  Patient quit smoking 6 years ago.  Patient's father had a debilitating stroke before his death some years ago he also had carotid artery stenosis on the left side.  He had a 2-pack-year history for over 30 years.  He had a normal CT of his chest done 5 years ago he believes.  Hemoglobin A1c had a complete physical done back in December was 5.7.  He has never been treated for diabetes.  He has left arm and hand pain associated with multiple traumas to the arm.  He had a fractured arm playing football years ago.  He tells that his hand was almost severed from his wrist from a garage door spring some years ago.  He is right-hand dominant.  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  He does not use illicit drugs.  He drinks 3 glasses of wine nightly.  He retired at age 18 from the [employer].  He stays active doing yoga and water aerobics.  He and his wife enjoy cruises all over the world.  He is able to see his ophthalmologist and dentist every 3 months.  History Acelin has a past medical history of Allergy, Depression, Glaucoma, seasonal allergies, Hypercholesterolemia, and Hypertension.   He has a past surgical history that includes Vasectomy and Colonoscopy with propofol (N/A, 11/27/2014).   His family history includes COPD in his father; Cancer in his mother; Drug abuse in his brother; Early death in his mother;  Heart attack in his maternal grandfather and maternal grandmother; Stroke in his father.He reports that he quit smoking about 6 years ago. His smoking use included cigarettes and cigars. He has never used smokeless tobacco. He reports that he drinks alcohol. He reports that he does not use drugs.  Outpatient Medications Prior to Visit  Medication Sig Dispense Refill  . amLODipine-benazepril (LOTREL) 10-20 MG per capsule Take 1 capsule by mouth daily.    Marland Kitchen aspirin EC 81 MG tablet Take 81 mg by mouth daily.    Marland Kitchen atorvastatin (LIPITOR) 10 MG tablet Take 10 mg by mouth daily at 6 PM.     . fenofibrate (TRICOR) 145 MG tablet Take 145 mg by mouth daily.    Marland Kitchen latanoprost (XALATAN) 0.005 % ophthalmic solution Place 1 drop into both eyes at bedtime.    . metoprolol succinate (TOPROL-XL) 100 MG 24 hr tablet Take 100 mg by mouth daily. Take with or immediately following a meal.    . loratadine (CLARITIN) 10 MG tablet Take 10 mg by mouth daily as needed for allergies.      No facility-administered medications prior to visit.     ROS Review of Systems  Constitutional: Negative.   HENT: Negative.   Eyes: Negative.   Respiratory: Negative.   Cardiovascular: Negative.   Gastrointestinal: Negative.   Endocrine: Negative for polyphagia and polyuria.  Genitourinary: Negative.  Negative for  decreased urine volume, difficulty urinating and hematuria.  Musculoskeletal: Positive for arthralgias and myalgias.  Skin: Negative for pallor and wound.  Allergic/Immunologic: Negative for immunocompromised state.  Neurological: Negative for weakness and headaches.  Hematological: Does not bruise/bleed easily.  Psychiatric/Behavioral: Negative.     Objective:  BP 134/80   Pulse 68   Ht 5\' 11"  (1.803 m)   Wt 249 lb (112.9 kg)   SpO2 98%   BMI 34.73 kg/m   Physical Exam  Constitutional: He is oriented to person, place, and time. He appears well-developed and well-nourished. No distress.  HENT:  Head:  Normocephalic and atraumatic.  Right Ear: External ear normal.  Left Ear: External ear normal.  Nose: Nose normal.  Mouth/Throat: Oropharynx is clear and moist. No oropharyngeal exudate.  Eyes: Pupils are equal, round, and reactive to light. Conjunctivae and EOM are normal. Left eye exhibits no discharge. No scleral icterus.  Neck: Normal range of motion. Neck supple. No JVD present. No tracheal deviation present. No thyromegaly present.  Cardiovascular: Normal rate, regular rhythm and normal heart sounds.  Pulmonary/Chest: Effort normal and breath sounds normal.  Abdominal: Soft. Bowel sounds are normal. He exhibits distension. He exhibits no mass. There is no tenderness. There is no guarding. A hernia is present. Hernia confirmed positive in the ventral area.  Neurological: He is alert and oriented to person, place, and time.  Skin: Skin is warm and dry. No rash noted. He is not diaphoretic.  Psychiatric: He has a normal mood and affect. His behavior is normal.      Assessment & Plan:   [place] was seen today for establish care.  Diagnoses and all orders for this visit:  Essential hypertension  Elevated cholesterol with elevated triglycerides  Controlled type 2 diabetes mellitus without complication, without long-term current use of insulin (HCC) -     Hemoglobin A1c -     Microalbumin / creatinine urine ratio -     metFORMIN (GLUCOPHAGE-XR) 500 MG 24 hr tablet; Take 1 tablet (500 mg total) by mouth at bedtime.  Stenosis of left carotid artery  Left arm pain -     Ambulatory referral to Sports Medicine  Glaucoma, unspecified glaucoma type, unspecified laterality  Discoid lupus  Alcohol use  Morbid obesity ([place])   I have discontinued Jeneen Rinks Rahl's loratadine. I am also having him start on metFORMIN. Additionally, I am having him maintain his metoprolol succinate, amLODipine-benazepril, fenofibrate, latanoprost, atorvastatin, and aspirin EC.  Meds ordered this encounter   Medications  . metFORMIN (GLUCOPHAGE-XR) 500 MG 24 hr tablet    Sig: Take 1 tablet (500 mg total) by mouth at bedtime.    Dispense:  90 tablet    Refill:  1   We had a thorough discussion about preventing the forgot progression of carotid artery stenosis to include treatment of blood pressure, diabetes and elevated cholesterol and blood fats.  Patient will continue monitoring for advice of vascular surgeon.  Discussed the importance of weight loss and he was given anticipatory guidance on the importance of weight loss with regards to also controlling the above and his diabetes.  He is status post recent physical exam through his former doctor.  Will recheck his hemoglobin A1c start him on Glucophage.  Discussed the importance of using Glucophage to prevent the progression of diabetes.  He is in agreement with doing so.  Follow-up: Return in about 3 months (around 01/12/2018).  Libby Maw, MD

## 2017-10-12 NOTE — Patient Instructions (Signed)
BMI for Adults Body mass index (BMI) is a number that is calculated from a person's weight and height. In most adults, the number is used to find how much of an adult's weight is made up of fat. BMI is not as accurate as a direct measure of body fat. How is BMI calculated? BMI is calculated by dividing weight in kilograms by height in meters squared. It can also be calculated by dividing weight in pounds by height in inches squared, then multiplying the resulting number by 703. Charts are available to help you find your BMI quickly and easily without doing this calculation. How is BMI interpreted? Health care professionals use BMI charts to identify whether an adult is underweight, at a normal weight, or overweight based on the following guidelines:  Underweight: BMI less than 18.5.  Normal weight: BMI between 18.5 and 24.9.  Overweight: BMI between 25 and 29.9.  Obese: BMI of 30 and above.  BMI is usually interpreted the same for males and females. Weight includes both fat and muscle, so someone with a muscular build, such as an athlete, may have a BMI that is higher than 24.9. In cases like these, BMI may not accurately depict body fat. To determine if excess body fat is the cause of a BMI of 25 or higher, further assessments may need to be done by a health care provider. Why is BMI a useful tool? BMI is used to identify a possible weight problem that may be related to a medical problem or may increase the risk for medical problems. BMI can also be used to promote changes to reach a healthy weight. This information is not intended to replace advice given to you by your health care provider. Make sure you discuss any questions you have with your health care provider. Document Released: 01/29/2004 Document Revised: 09/27/2015 Document Reviewed: 10/14/2013 Elsevier Interactive Patient Education  2018 [place]. Carotid Artery Disease The carotid arteries are the two main arteries on either  side of the neck that supply blood to the brain. Carotid artery disease, also called carotid artery stenosis, is the narrowing or blockage of one or both carotid arteries. Carotid artery disease increases your risk for a stroke or a transient ischemic attack (TIA). A TIA is an episode in which a waxy, fatty substance that accumulates within the artery (plaque) blocks blood flow to the brain. A TIA is considered a "warning stroke." What are the causes?  Buildup of plaque inside the carotid arteries (atherosclerosis) (common).  A weakened outpouching in an artery (aneurysm).  Inflammation of the carotid artery (arteritis).  A fibrous growth within the carotid artery (fibromuscular dysplasia).  Tissue death within the carotid artery due to radiation treatment (post-radiation necrosis).  Decreased blood flow due to spasms of the carotid artery (vasospasm).  Separation of the walls of the carotid artery (carotid dissection). What increases the risk?  High cholesterol (dyslipidemia).  High blood pressure (hypertension).  Smoking.  Obesity.  Diabetes.  Family history of cardiovascular disease.  Inactivity or lack of regular exercise.  Being male. Men have an increased risk of developing atherosclerosis earlier in life than women. What are the signs or symptoms? Carotid artery disease does not cause symptoms. How is this diagnosed? Diagnosis of carotid artery disease may include:  A physical exam. Your health care provider may hear an abnormal sound (bruit) when listening to the carotid arteries.  Specific tests that look at the blood flow in the carotid arteries. These tests include: ? Carotid  artery ultrasonography. ? Carotid or cerebral angiography. ? Computerized tomographic angiography (CTA). ? Magnetic resonance angiography (MRA).  How is this treated? Treatment of carotid artery disease can include a combination of treatments. Treatment options include:  Surgery. You  may have: ? A carotid endarterectomy. This is a surgery to remove the blockages in the carotid arteries. ? A carotid angioplasty with stenting. This is a nonsurgical interventional procedure. A wire mesh (stent) is used to widen the blocked carotid arteries.  Medicines to control blood pressure, cholesterol, and reduce blood clotting (antiplatelet therapy).  Adjusting your diet.  Lifestyle changes such as: ? Quitting smoking. ? Exercising as tolerated or as directed by your health care provider. ? Controlling and maintaining a good blood pressure. ? Keeping cholesterol levels under control.  Follow these instructions at home:  Take medicines only as directed by your health care provider. Make sure you understand all your medicine instructions. Do not stop your medicines without talking to your health care provider.  Follow your health care provider's diet instructions. It is important to eat a healthy diet that is low in saturated fats and includes plenty of fresh fruits, vegetables, and lean meats. High-fat, high-sodium foods as well as foods that are fried, overly processed, or have poor nutritional value should be avoided.  Maintain a healthy weight.  Stay physically active. It is recommended that you get at least 30 minutes of activity every day.  Do not use any tobacco products including cigarettes, chewing tobacco, or electronic cigarettes. If you need help quitting, ask your health care provider.  Limit alcohol use to: ? No more than 2 drinks per day for men. ? No more than 1 drink per day for nonpregnant women.  Do not use illegal drugs.  Keep all follow-up visits as directed by your health care provider. Get help right away if: You develop TIA or stroke symptoms. These include:  Sudden weakness or numbness on one side of the body, such as in the face, arm, or leg.  Sudden confusion.  Trouble speaking (aphasia) or understanding.  Sudden trouble seeing out of one or  both eyes.  Sudden trouble walking.  Dizziness or feeling like you might faint.  Loss of balance or coordination.  Sudden severe headache with no known cause.  Sudden trouble swallowing (dysphagia).  If you have any of these symptoms, call your local emergency services (911 in [place] Do not drive yourself to the clinic or hospital. This is a medical emergency. This information is not intended to replace advice given to you by your health care provider. Make sure you discuss any questions you have with your health care provider. Document Released: 08/11/2011 Document Revised: 10/25/2015 Document Reviewed: 11/17/2012 Elsevier Interactive Patient Education  2017 [place] With Diabetes Diabetes (type 1 diabetes mellitus or type 2 diabetes mellitus) is a condition in which the body does not have enough of a hormone called insulin, or the body does not respond properly to insulin. Normally, insulin allows sugars (glucose) to enter cells in the body. The cells use glucose for energy. With diabetes, extra glucose builds up in the blood instead of going into cells, which results in high blood glucose (hyperglycemia). How to manage lifestyle changes Managing diabetes includes medical treatments as well as lifestyle changes. If diabetes is not managed well, serious physical and emotional complications can occur. Taking good care of yourself means that you are responsible for:  Monitoring glucose regularly.  Eating a healthy diet.  Exercising regularly.  Meeting with health care providers.  Taking medicines as directed.  Some people may feel a lot of stress about managing their diabetes. This is known as emotional distress, and it is very common. Living with diabetes can place you at risk for emotional distress, depression, or anxiety. These disorders can be confusing and can make diabetes management more difficult. How to recognize stress Emotional distress Symptoms of emotional  distress include:  Anger about having a diagnosis of diabetes.  Fear or frustration about your diagnosis and the changes you need to make to manage the condition.  Being overly worried about the care that you need or the cost of the care you need.  Feeling like you caused your condition by doing something wrong.  Fear of unpredictable situations, like low or high blood glucose.  Feeling judged by your health care providers.  Feeling very alone with the disease.  Getting too tired or "burned out" with the demands of daily care.  Depression Having diabetes means that you are at a higher risk for depression. Having depression also means that you are at a higher risk for diabetes. Your health care provider may test (screen) you for symptoms of depression. It is important to recognize depression symptoms and to start treatment for it soon after it is diagnosed. The following are some symptoms of depression:  Loss of interest in things that you used to enjoy.  Trouble sleeping, or often waking up early and not being able to get back to sleep.  A change in appetite.  Feeling tired most of the day.  Feeling nervous and anxious.  Feeling guilty and worrying that you are a burden to others.  Feeling depressed more often than you do not feel that way.  Thoughts of hurting yourself or feeling that you want to die.  If you have any of these symptoms for 2 weeks or longer, reach out to a health care provider. Where to find support  Ask your health care provider to recommend a therapist who understands both depression and diabetes.  Search for information and support from the American Diabetes Association: www.diabetes.org  Find a certified diabetes educator and make an appointment through [place] [facility] [employer]: www.diabeteseducator.org Follow these instructions at home: Managing emotional distress The following are some ways to manage emotional distress:  Talk  with your health care provider or certified diabetes educator. Consider working with a counselor or therapist.  Learn as much as you can about diabetes and its treatment. Meet with a certified diabetes educator or take a class to learn how to manage your condition.  Keep a journal of your thoughts and concerns.  Accept that some things are out of your control.  Talk with other people who have diabetes. It can help to talk with others about the emotional distress that you feel.  Find ways to manage stress that work for you. These may include art or music therapy, exercise, meditation, and hobbies.  Seek support from spiritual leaders, family, and friends.  General instructions  Follow your diabetes management plan.  Keep all follow-up visits as told by your health care provider. This is important. Get help right away if:  You have thoughts about hurting yourself or others. If you ever feel like you may hurt yourself or others, or have thoughts about taking your own life, get help right away. You can go to your nearest emergency department or call:  Your local emergency services (911 in the [place]  A suicide crisis  helpline, such as the [facility] at 831-088-9983. This is open 24 hours a day.  Summary  Diabetes (type 1 diabetes mellitus or type 2 diabetes mellitus) is a condition in which the body does not have enough of a hormone called insulin, or the body does not respond properly to insulin.  Living with diabetes puts you at risk for medical issues, and it also puts you at risk for emotional issues such as emotional distress, depression, and anxiety.  Recognizing the symptoms of emotional distress and depression may help you avoid problems with your diabetes control. It is important to start treatment for emotional distress and depression soon after they are diagnosed.  Having diabetes means that you are at a higher risk for depression. Ask your  health care provider to recommend a therapist who understands both depression and diabetes.  If you experience symptoms of emotional distress or depression, it is important to discuss this with your health care provider, certified diabetes educator, or therapist. This information is not intended to replace advice given to you by your health care provider. Make sure you discuss any questions you have with your health care provider. Document Released: 10/02/2016 Document Revised: 10/02/2016 Document Reviewed: 10/02/2016 Elsevier Interactive Patient Education  2018 [employer].  Obesity, Adult Obesity is the condition of having too much total body fat. Being overweight or obese means that your weight is greater than what is considered healthy for your body size. Obesity is determined by a measurement called BMI. BMI is an estimate of body fat and is calculated from height and weight. For adults, a BMI of 30 or higher is considered obese. Obesity can eventually lead to other health concerns and major illnesses, including:  Stroke.  Coronary artery disease (CAD).  Type 2 diabetes.  Some types of cancer, including cancers of the colon, breast, uterus, and gallbladder.  Osteoarthritis.  High blood pressure (hypertension).  High cholesterol.  Sleep apnea.  Gallbladder stones.  Infertility problems.  What are the causes? The main cause of obesity is taking in (consuming) more calories than your body uses for energy. Other factors that contribute to this condition may include:  Being born with genes that make you more likely to become obese.  Having a medical condition that causes obesity. These conditions include: ? Hypothyroidism. ? Polycystic ovarian syndrome (PCOS). ? Binge-eating disorder. ? Cushing syndrome.  Taking certain medicines, such as steroids, antidepressants, and seizure medicines.  Not being physically active (sedentary lifestyle).  Living where there are  limited places to exercise safely or buy healthy foods.  Not getting enough sleep.  What increases the risk? The following factors may increase your risk of this condition:  Having a family history of obesity.  Being a woman of African-American descent.  Being a man of Hispanic descent.  What are the signs or symptoms? Having excessive body fat is the main symptom of this condition. How is this diagnosed? This condition may be diagnosed based on:  Your symptoms.  Your medical history.  A physical exam. Your health care provider may measure: ? Your BMI. If you are an adult with a BMI between 25 and less than 30, you are considered overweight. If you are an adult with a BMI of 30 or higher, you are considered obese. ? The distances around your hips and your waist (circumferences). These may be compared to each other to help diagnose your condition. ? Your skinfold thickness. Your health care provider may gently pinch a fold  of your skin and measure it.  How is this treated? Treatment for this condition often includes changing your lifestyle. Treatment may include some or all of the following:  Dietary changes. Work with your health care provider and a dietitian to set a weight-loss goal that is healthy and reasonable for you. Dietary changes may include eating: ? Smaller portions. A portion size is the amount of a particular food that is healthy for you to eat at one time. This varies from person to person. ? Low-calorie or low-fat options. ? More whole grains, fruits, and vegetables.  Regular physical activity. This may include aerobic activity (cardio) and strength training.  Medicine to help you lose weight. Your health care provider may prescribe medicine if you are unable to lose 1 pound a week after 6 weeks of eating more healthily and doing more physical activity.  Surgery. Surgical options may include gastric banding and gastric bypass. Surgery may be done if: ? Other  treatments have not helped to improve your condition. ? You have a BMI of 40 or higher. ? You have life-threatening health problems related to obesity.  Follow these instructions at home:  Eating and drinking   Follow recommendations from your health care provider about what you eat and drink. Your health care provider may advise you to: ? Limit fast foods, sweets, and processed snack foods. ? Choose low-fat options, such as low-fat milk instead of whole milk. ? Eat 5 or more servings of fruits or vegetables every day. ? Eat at home more often. This gives you more control over what you eat. ? Choose healthy foods when you eat out. ? Learn what a healthy portion size is. ? Keep low-fat snacks on hand. ? Avoid sugary drinks, such as soda, fruit juice, iced tea sweetened with sugar, and flavored milk. ? Eat a healthy breakfast.  Drink enough water to keep your urine clear or pale yellow.  Do not go without eating for long periods of time (do not fast) or follow a fad diet. Fasting and fad diets can be unhealthy and even dangerous. Physical Activity  Exercise regularly, as told by your health care provider. Ask your health care provider what types of exercise are safe for you and how often you should exercise.  Warm up and stretch before being active.  Cool down and stretch after being active.  Rest between periods of activity. Lifestyle  Limit the time that you spend in front of your TV, computer, or video game system.  Find ways to reward yourself that do not involve food.  Limit alcohol intake to no more than 1 drink a day for nonpregnant women and 2 drinks a day for men. One drink equals 12 oz of beer, 5 oz of wine, or 1 oz of hard liquor. General instructions  Keep a weight loss journal to keep track of the food you eat and how much you exercise you get.  Take over-the-counter and prescription medicines only as told by your health care provider.  Take vitamins and  supplements only as told by your health care provider.  Consider joining a support group. Your health care provider may be able to recommend a support group.  Keep all follow-up visits as told by your health care provider. This is important. Contact a health care provider if:  You are unable to meet your weight loss goal after 6 weeks of dietary and lifestyle changes. This information is not intended to replace advice given to you by  your health care provider. Make sure you discuss any questions you have with your health care provider. Document Released: 06/26/2004 Document Revised: 10/22/2015 Document Reviewed: 03/07/2015 Elsevier Interactive Patient Education  2018 [employer].

## 2017-10-13 ENCOUNTER — Encounter: Payer: Self-pay | Admitting: Neurology

## 2017-10-13 ENCOUNTER — Ambulatory Visit: Payer: No Typology Code available for payment source | Admitting: Family Medicine

## 2017-10-13 ENCOUNTER — Encounter: Payer: Self-pay | Admitting: Family Medicine

## 2017-10-13 ENCOUNTER — Ambulatory Visit (INDEPENDENT_AMBULATORY_CARE_PROVIDER_SITE_OTHER): Payer: No Typology Code available for payment source

## 2017-10-13 VITALS — BP 141/82 | HR 62 | Temp 98.0°F | Ht 71.0 in | Wt 249.0 lb

## 2017-10-13 DIAGNOSIS — M25512 Pain in left shoulder: Secondary | ICD-10-CM | POA: Insufficient documentation

## 2017-10-13 DIAGNOSIS — M79602 Pain in left arm: Secondary | ICD-10-CM

## 2017-10-13 LAB — HEMOGLOBIN A1C
EAG (MMOL/L): 7 (calc)
Hgb A1c MFr Bld: 6 % of total Hgb — ABNORMAL HIGH (ref <5.7)
Mean Plasma Glucose: 126 (calc)

## 2017-10-13 LAB — MICROALBUMIN / CREATININE URINE RATIO
CREATININE, URINE: 280 mg/dL (ref 20–320)
MICROALB UR: 12.7 mg/dL
MICROALB/CREAT RATIO: 45 ug/mg{creat} — AB (ref <30)

## 2017-10-13 MED ORDER — DICLOFENAC SODIUM 2 % TD SOLN: 1.0000 "application " | Freq: Two times a day (BID) | TRANSDERMAL | 3 refills | Status: DC

## 2017-10-13 NOTE — Progress Notes (Signed)
Nathaniel Proctor - 62 y.o. male MRN [id number]  Date of birth: 1955/07/24  SUBJECTIVE:  Including CC & ROS.  Chief Complaint  Patient presents with  . Left arm pain    Nathaniel Proctor is a 62 y.o. male that is presenting left shoulder pain and abnormal movement of his left hand. Ongoing for four months. Denies any inciting event. Pain is intermittent in nature. Pain is on the anterior portion of his upper extremity. Pain is intermittent. Mild to severe when he raises his arm. He does yoga twice a week. Pain worsens with activity. He states the pain stabbing. He has not taken anything for the pain.   His left hand has had been abnormally closing for the past year. He will end up crushing something in his hand if it is styrofoam. His slow movement of his hand had decreased. His left grip strength isn't as much compared to the right. History of left wrist tendon repair in 2000.     Review of Systems  Constitutional: Negative for fever.  HENT: Negative for congestion.   Respiratory: Negative for cough.   Cardiovascular: Negative for chest pain.  Gastrointestinal: Negative for abdominal pain.  Musculoskeletal: Negative for gait problem.  Skin: Negative for color change.  Neurological: Positive for weakness.  Hematological: Negative for adenopathy.  Psychiatric/Behavioral: Negative for agitation.    HISTORY: Past Medical, Surgical, Social, and Family History Reviewed & Updated per EMR.   Pertinent Historical Findings include:  Past Medical History:  Diagnosis Date  . Allergy   . Depression   . Glaucoma   . Hx of seasonal allergies   . Hypercholesterolemia   . Hypertension     Past Surgical History:  Procedure Laterality Date  . COLONOSCOPY WITH PROPOFOL N/A 11/27/2014   Procedure: COLONOSCOPY WITH PROPOFOL;  Surgeon: Garlan Fair, MD;  Location: [facility];  Service: Endoscopy;  Laterality: N/A;  . Left wrist tendon repair Left 2000   [place]  . VASECTOMY      No Known  Allergies  Family History  Problem Relation Age of Onset  . Cancer Mother   . Early death Mother   . COPD Father   . Stroke Father   . Drug abuse Brother   . Heart attack Maternal Grandmother   . Heart attack Maternal Grandfather      Social History   Socioeconomic History  . Marital status: Married    Spouse name: Not on file  . Number of children: Not on file  . Years of education: Not on file  . Highest education level: Not on file  Occupational History  . Not on file  Social Needs  . Financial resource strain: Not on file  . Food insecurity:    Worry: Not on file    Inability: Not on file  . Transportation needs:    Medical: Not on file    Non-medical: Not on file  Tobacco Use  . Smoking status: Former Smoker    Types: Cigarettes, Cigars    Last attempt to quit: 02/21/2011    Years since quitting: 6.6  . Smokeless tobacco: Never Used  Substance and Sexual Activity  . Alcohol use: Yes    Comment: wine  occ.  . Drug use: No  . Sexual activity: Not on file  Lifestyle  . Physical activity:    Days per week: Not on file    Minutes per session: Not on file  . Stress: Not on file  Relationships  . Social  connections:    Talks on phone: Not on file    Gets together: Not on file    Attends religious service: Not on file    Active member of club or organization: Not on file    Attends meetings of clubs or organizations: Not on file    Relationship status: Not on file  . Intimate partner violence:    Fear of current or ex partner: Not on file    Emotionally abused: Not on file    Physically abused: Not on file    Forced sexual activity: Not on file  Other Topics Concern  . Not on file  Social History Narrative  . Not on file     PHYSICAL EXAM:  VS: BP (!) 141/82 (BP Location: Right Arm, Patient Position: Sitting, Cuff Size: Normal)   Pulse 62   Temp 98 F (36.7 C) (Oral)   Ht 5\' 11"  (1.803 m)   Wt 249 lb (112.9 kg)   SpO2 95%   BMI 34.73 kg/m    Physical Exam Gen: NAD, alert, cooperative with exam, well-appearing ENT: normal lips, normal nasal mucosa,  Eye: normal EOM, normal conjunctiva and lids CV:  no edema, +2 pedal pulses   Resp: no accessory muscle use, non-labored,  Skin: no rashes, no areas of induration  Neuro: normal tone, normal sensation to touch Psych:  normal insight, alert and oriented MSK:  Left shoulder :  Normal active flexion and abduction  Normal ER  Normal IR and ER strength to resistance.  Pain with empty can testing and Hawkin's testing  Left hand:  Weakened grip strength  Normal finger adduction and abduction strength to resistance  Normal pincer grasp but takes time to develop  Normal thumb opposition  Normal wrist flexion and extension strength to resistance  Unable to stop the closure of his hand  Neurovascularly intact   Limited ultrasound: left shoulder:  No changes at PMT Normal BT and subscapularis  Supraspinatus with chronic tendinopathy type changes and hypoechoic findings superficial to the supraspinatus to suggest a subacromial bursitis  Normal appearing AC joint   Summary: subacromial bursitis and supraspinatus tendinopathy type changes. No changes observed at his point of maximal tenderness.   Ultrasound and interpretation by Clearance Coots, MD      ASSESSMENT & PLAN:   Left arm pain He reports having a problem unopposed flexion of his hand. Is not able to button his shirts or tie his shoes without help.  - EMG  - may need a referral to neurology for further evaluation   Acute pain of left shoulder Unclear if his pain is associated with the problems seen in his shoulder on [place]. He has pinpoint pain on over his anterior shoulder near his pec major insertion.  - counseled on HEP  - if no improvement consider PT or imaging or nitro.

## 2017-10-13 NOTE — Assessment & Plan Note (Signed)
He reports having a problem unopposed flexion of his hand. Is not able to button his shirts or tie his shoes without help.  - EMG  - may need a referral to neurology for further evaluation

## 2017-10-13 NOTE — Patient Instructions (Signed)
Nice to meet you  Please try the exercises for your shoulder  Please try the pennsaid over the area  You will get a call to schedule the nerve conduction study  Please follow up with me in 3-4 weeks if there is no improvement in your shoulder pain

## 2017-10-13 NOTE — Assessment & Plan Note (Signed)
Unclear if his pain is associated with the problems seen in his shoulder on [place]. He has pinpoint pain on over his anterior shoulder near his pec major insertion.  - counseled on HEP  - if no improvement consider PT or imaging or nitro.

## 2017-11-05 ENCOUNTER — Telehealth: Payer: Self-pay | Admitting: Family Medicine

## 2017-11-05 NOTE — Telephone Encounter (Signed)
Copied from [place] [id number]. Topic: Quick Communication - See Telephone Encounter >> Nov 05, 2017  2:18 PM Robina Ade, Helene Kelp D wrote: CRM for notification. See Telephone encounter for: 11/05/17. Christal with [place] with the [facility] authorization department called needing office notes for medication Diclofenac Sodium (PENNSAID) 2 % SOLN, why patient needs it. Her call back number is [phone].

## 2017-11-09 NOTE — Telephone Encounter (Signed)
Done

## 2017-12-22 ENCOUNTER — Encounter: Payer: Self-pay | Admitting: Neurology

## 2017-12-22 ENCOUNTER — Ambulatory Visit: Payer: No Typology Code available for payment source | Admitting: Neurology

## 2017-12-22 VITALS — BP 150/96 | HR 89 | Ht 71.0 in | Wt 247.0 lb

## 2017-12-22 DIAGNOSIS — R292 Abnormal reflex: Secondary | ICD-10-CM | POA: Diagnosis not present

## 2017-12-22 DIAGNOSIS — M79602 Pain in left arm: Secondary | ICD-10-CM

## 2017-12-22 NOTE — Progress Notes (Signed)
NEUROLOGY CONSULTATION NOTE  Domingos Riggi MRN: [id number] DOB: Sep 23, 1955  Referring provider: Clearance Coots, MD Primary care provider: Abelino Derrick, MD  Reason for consult:  Left shoulder pain  HISTORY OF PRESENT ILLNESS: Nathaniel Proctor is a 62 year old right-handed male with left carotid artery stenosis, depression, hypertension, hypercholesterolemia and glaucoma who presents for left upper extremity pain.  History supplemented by referring provider's note.  He is accompanied by his wife who supplements history.  About 3 years ago, he started having left anterior shoulder pain, described as a focal non-radiating stabbing pain near the pectoralis major insertion.  He also reports discomfort in the forearm and hand.  Hand feels a bit achy.  If he holds an object in his left hand, his hand will start involuntarily close.  If he is holding a mug, he would drop it.  If he is holding a styrofoam cup, he would crush it.  He notes some associated weakness but denies numbness or tingling.  He reports difficulty tying his shoe or buttoning his shirt.  Symptoms have gradually progressed.  He denies neck pain.  He denies double vision, dysphagia, or unsteady gait.    He was evaluated in the ED on 06/08/17 where CT of head was personally reviewed and demonstrated mild generalized atrophy but no acute findings.  He followed up with Sports Medicine.  He had an ultrasound of the left upper extremity on 11/10/17 which demonstrated sbacromial bursitis and supraspinatus tendinopathy type changes but otherwise unremarkable.  CTA of neck from 02/18/17 demonstrated 65-70% left ICA stenosis, 60mg  proximal left subclavian artery stenosis and moderate distal left vertebral artery stenosis just proximal to the vertebrobasilar junction.   PAST MEDICAL HISTORY: Past Medical History:  Diagnosis Date  . Allergy   . Depression   . Glaucoma   . Hx of seasonal allergies   . Hypercholesterolemia   . Hypertension      PAST SURGICAL HISTORY: Past Surgical History:  Procedure Laterality Date  . COLONOSCOPY WITH PROPOFOL N/A 11/27/2014   Procedure: COLONOSCOPY WITH PROPOFOL;  Surgeon: Garlan Fair, MD;  Location: [facility];  Service: Endoscopy;  Laterality: N/A;  . Left wrist tendon repair Left 2000   [place]  . VASECTOMY      MEDICATIONS: Current Outpatient Medications on File Prior to Visit  Medication Sig Dispense Refill  . aspirin 325 MG tablet Take 325 mg by mouth daily.    Marland Kitchen amLODipine-benazepril (LOTREL) 10-20 MG per capsule Take 1 capsule by mouth daily.    Marland Kitchen atorvastatin (LIPITOR) 10 MG tablet Take 10 mg by mouth daily at 6 PM.     . Diclofenac Sodium (PENNSAID) 2 % SOLN Place 1 application onto the skin 2 (two) times daily. 1 Bottle 3  . fenofibrate (TRICOR) 145 MG tablet Take 145 mg by mouth daily.    Marland Kitchen latanoprost (XALATAN) 0.005 % ophthalmic solution Place 1 drop into both eyes at bedtime.    . metFORMIN (GLUCOPHAGE-XR) 500 MG 24 hr tablet Take 1 tablet (500 mg total) by mouth at bedtime. 90 tablet 1  . metoprolol succinate (TOPROL-XL) 100 MG 24 hr tablet Take 100 mg by mouth daily. Take with or immediately following a meal.     No current facility-administered medications on file prior to visit.     ALLERGIES: No Known Allergies  FAMILY HISTORY: Family History  Problem Relation Age of Onset  . Cancer Mother   . Early death Mother 57  . COPD Father   .  Stroke Father   . Drug abuse Brother   . Heart attack Maternal Grandmother   . Heart attack Maternal Grandfather     SOCIAL HISTORY: Social History   Socioeconomic History  . Marital status: Married    Spouse name: Bonnita Nasuti  . Number of children: 2  . Years of education: Not on file  . Highest education level: Bachelor's degree (e.g., BA, AB, BS)  Occupational History  . Occupation: retired  Scientific laboratory technician  . Financial resource strain: Not on file  . Food insecurity:    Worry: Not on file    Inability:  Not on file  . Transportation needs:    Medical: Not on file    Non-medical: Not on file  Tobacco Use  . Smoking status: Former Smoker    Types: Cigarettes, Cigars    Last attempt to quit: 02/21/2011    Years since quitting: 6.8  . Smokeless tobacco: Never Used  Substance and Sexual Activity  . Alcohol use: Yes    Comment: wine  occ.  . Drug use: No  . Sexual activity: Not on file  Lifestyle  . Physical activity:    Days per week: Not on file    Minutes per session: Not on file  . Stress: Not on file  Relationships  . Social connections:    Talks on phone: Not on file    Gets together: Not on file    Attends religious service: Not on file    Active member of club or organization: Not on file    Attends meetings of clubs or organizations: Not on file    Relationship status: Not on file  . Intimate partner violence:    Fear of current or ex partner: Not on file    Emotionally abused: Not on file    Physically abused: Not on file    Forced sexual activity: Not on file  Other Topics Concern  . Not on file  Social History Narrative   Patient is right-handed. He lives with his wife ina 2 story house. He drinks 4-5 cups of coffee a day, and an occasional glass of tea. He participates in Yoga on Tu and Th, and water aerobics on Mo, We and Fr, all for 60 minutes.    REVIEW OF SYSTEMS: Constitutional: No fevers, chills, or sweats, no generalized fatigue, change in appetite Eyes: No visual changes, double vision, eye pain Ear, nose and throat: No hearing loss, ear pain, nasal congestion, sore throat Cardiovascular: No chest pain, palpitations Respiratory:  No shortness of breath at rest or with exertion, wheezes GastrointestinaI: No nausea, vomiting, diarrhea, abdominal pain, fecal incontinence Genitourinary:  No dysuria, urinary retention or frequency Musculoskeletal:  No neck pain, back pain Integumentary: No rash, pruritus, skin lesions Neurological: as above Psychiatric: No  depression, insomnia, anxiety Endocrine: No palpitations, fatigue, diaphoresis, mood swings, change in appetite, change in weight, increased thirst Hematologic/Lymphatic:  No purpura, petechiae. Allergic/Immunologic: no itchy/runny eyes, nasal congestion, recent allergic reactions, rashes  PHYSICAL EXAM: Vitals:   12/22/17 1010  BP: (!) 150/96  Pulse: 89  SpO2: 95%   General: No acute distress.  Patient appears well-groomed.  Head:  Normocephalic/atraumatic Eyes:  fundi examined but not visualized Neck: supple, no paraspinal tenderness, full range of motion Back: No paraspinal tenderness Heart: regular rate and rhythm Lungs: Clear to auscultation bilaterally. Vascular: No carotid bruits. Neurological Exam: Mental status: alert and oriented to person, place, and time, recent and remote memory intact, fund of knowledge intact, attention  and concentration intact, speech fluent and not dysarthric, language intact. Cranial nerves: CN I: not tested CN II: pupils equal, round and reactive to light, visual fields intact CN III, IV, VI:  full range of motion, no nystagmus, no ptosis CN V: facial sensation intact CN VII: upper and lower face symmetric CN VIII: hearing intact CN IX, X: gag intact, uvula midline CN XI: sternocleidomastoid and trapezius muscles intact CN XII: tongue midline Bulk & Tone: Mildly increased tone in the left upper extremity.  Normal bulk; no fasciculations. Motor:  5/5 throughout  Sensation:  Pinprick and vibration sensation intact. Deep Tendon Reflexes:  3+ left triceps, biceps and brachioradialis; otherwise 2+ throughout, toes downgoing.  Finger to nose testing:  Left intention tremor. Heel to shin:  Without dysmetria.  Gait:  Normal station and stride.  Able to turn and tandem walk. Romberg negative.  IMPRESSION: 1.  Left shoulder pain with weakness. He exhibits mildly increased tone and hyperreflexia in the left arm, suggesting CNS etiology rather than  radiculopathy or plexopathy.  PLAN: 1.  We will start with MRI of cervical spine with and without contrast.  Further recommendations or testing (such as MRI of brain or NCV-EMG) pending results. 2.  Follow up after testing  Thank you for allowing me to take part in the care of this patient.  Metta Clines, DO  CC: Clearance Coots, MD  Abelino Derrick, MD

## 2017-12-22 NOTE — Patient Instructions (Addendum)
1.  First we will check MRI of the cervical spine with and without contrast.  Further recommendations pending results.  We have sent a referral to [facility] for your MRI and they will call you directly to schedule your appt. They are located at [place]. If you need to contact them directly please call [phone].

## 2017-12-30 ENCOUNTER — Ambulatory Visit
Admission: RE | Admit: 2017-12-30 | Discharge: 2017-12-30 | Disposition: A | Discharge status: Home / Self Care | Payer: No Typology Code available for payment source | Source: Ambulatory Visit | Attending: Neurology | Admitting: Neurology

## 2017-12-30 DIAGNOSIS — M79602 Pain in left arm: Secondary | ICD-10-CM

## 2017-12-30 DIAGNOSIS — R292 Abnormal reflex: Secondary | ICD-10-CM

## 2017-12-30 MED ORDER — GADOBENATE DIMEGLUMINE 529 MG/ML IV SOLN
20.0000 mL | Freq: Once | INTRAVENOUS | Status: AC | PRN
  Administered 2017-12-30: 20 mL via INTRAVENOUS

## 2018-01-01 ENCOUNTER — Telehealth: Payer: Self-pay

## 2018-01-01 DIAGNOSIS — R292 Abnormal reflex: Secondary | ICD-10-CM

## 2018-01-01 NOTE — Telephone Encounter (Signed)
Called and spoke with Pt, advised him of c spine MRI results and additional MRI brain recommendation.

## 2018-01-01 NOTE — Telephone Encounter (Signed)
-----   Message from Pieter Partridge, DO sent at 01/01/2018  6:10 AM EDT ----- MRI of cervical spine shows possible pinched nerve that may or may not be contributing.  However, given his increased reflex in the left arm, I would like to check MRI of brain with and without contrast to look for possible cause of his symptoms.

## 2018-01-09 ENCOUNTER — Ambulatory Visit
Admission: RE | Admit: 2018-01-09 | Discharge: 2018-01-09 | Disposition: A | Discharge status: Home / Self Care | Payer: No Typology Code available for payment source | Source: Ambulatory Visit | Attending: Neurology | Admitting: Neurology

## 2018-01-09 DIAGNOSIS — R292 Abnormal reflex: Secondary | ICD-10-CM

## 2018-01-09 MED ORDER — GADOBENATE DIMEGLUMINE 529 MG/ML IV SOLN
20.0000 mL | Freq: Once | INTRAVENOUS | Status: AC | PRN
  Administered 2018-01-09: 20 mL via INTRAVENOUS

## 2018-01-14 ENCOUNTER — Telehealth: Payer: Self-pay

## 2018-01-14 ENCOUNTER — Ambulatory Visit: Payer: No Typology Code available for payment source | Admitting: Family Medicine

## 2018-01-14 ENCOUNTER — Encounter: Payer: Self-pay | Admitting: Family Medicine

## 2018-01-14 VITALS — BP 128/80 | HR 77 | Ht 71.0 in | Wt 247.4 lb

## 2018-01-14 DIAGNOSIS — E119 Type 2 diabetes mellitus without complications: Secondary | ICD-10-CM

## 2018-01-14 DIAGNOSIS — I10 Essential (primary) hypertension: Secondary | ICD-10-CM | POA: Diagnosis not present

## 2018-01-14 DIAGNOSIS — I6522 Occlusion and stenosis of left carotid artery: Secondary | ICD-10-CM

## 2018-01-14 DIAGNOSIS — E782 Mixed hyperlipidemia: Secondary | ICD-10-CM

## 2018-01-14 DIAGNOSIS — Z1159 Encounter for screening for other viral diseases: Secondary | ICD-10-CM

## 2018-01-14 MED ORDER — METFORMIN HCL ER 500 MG PO TB24: 500.0000 mg | ORAL_TABLET | Freq: Every day | ORAL | 1 refills | Status: DC

## 2018-01-14 MED ORDER — AMLODIPINE BESY-BENAZEPRIL HCL 10-20 MG PO CAPS: 1.0000 | ORAL_CAPSULE | Freq: Every day | ORAL | 1 refills | Status: DC

## 2018-01-14 MED ORDER — FENOFIBRATE 145 MG PO TABS: 145.0000 mg | ORAL_TABLET | Freq: Every day | ORAL | 1 refills | Status: DC

## 2018-01-14 MED ORDER — ATORVASTATIN CALCIUM 10 MG PO TABS: 10.0000 mg | ORAL_TABLET | Freq: Every day | ORAL | 1 refills | Status: DC

## 2018-01-14 MED ORDER — METOPROLOL SUCCINATE ER 100 MG PO TB24: 100.0000 mg | ORAL_TABLET | Freq: Every day | ORAL | 1 refills | Status: DC

## 2018-01-14 NOTE — Telephone Encounter (Signed)
-----   Message from Pieter Partridge, DO sent at 01/11/2018  7:59 AM EDT ----- MRI of brain is normal.  I would like to check NCV-EMG of left upper extremity pain and dystonia

## 2018-01-14 NOTE — Telephone Encounter (Signed)
Called and spoke with Pt, advised of MRI results, trx to front desk to schedule EMG

## 2018-01-14 NOTE — Patient Instructions (Signed)
Exercising to Lose Weight Exercising can help you to lose weight. In order to lose weight through exercise, you need to do vigorous-intensity exercise. You can tell that you are exercising with vigorous intensity if you are breathing very hard and fast and cannot hold a conversation while exercising. Moderate-intensity exercise helps to maintain your current weight. You can tell that you are exercising at a moderate level if you have a higher heart rate and faster breathing, but you are still able to hold a conversation. How often should I exercise? Choose an activity that you enjoy and set realistic goals. Your health care provider can help you to make an activity plan that works for you. Exercise regularly as directed by your health care provider. This may include:  Doing resistance training twice each week, such as: ? Push-ups. ? Sit-ups. ? Lifting weights. ? Using resistance bands.  Doing a given intensity of exercise for a given amount of time. Choose from these options: ? 150 minutes of moderate-intensity exercise every week. ? 75 minutes of vigorous-intensity exercise every week. ? A mix of moderate-intensity and vigorous-intensity exercise every week.  Children, pregnant women, people who are out of shape, people who are overweight, and older adults may need to consult a health care provider for individual recommendations. If you have any sort of medical condition, be sure to consult your health care provider before starting a new exercise program. What are some activities that can help me to lose weight?  Walking at a rate of at least 4.5 miles an hour.  Jogging or running at a rate of 5 miles per hour.  Biking at a rate of at least 10 miles per hour.  Lap swimming.  Roller-skating or in-line skating.  Cross-country skiing.  Vigorous competitive sports, such as football, basketball, and soccer.  Jumping rope.  Aerobic dancing. How can I be more active in my day-to-day  activities?  Use the stairs instead of the elevator.  Take a walk during your lunch break.  If you drive, park your car farther away from work or school.  If you take public transportation, get off one stop early and walk the rest of the way.  Make all of your phone calls while standing up and walking around.  Get up, stretch, and walk around every 30 minutes throughout the day. What guidelines should I follow while exercising?  Do not exercise so much that you hurt yourself, feel dizzy, or get very short of breath.  Consult your health care provider prior to starting a new exercise program.  Wear comfortable clothes and shoes with good support.  Drink plenty of water while you exercise to prevent dehydration or heat stroke. Body water is lost during exercise and must be replaced.  Work out until you breathe faster and your heart beats faster. This information is not intended to replace advice given to you by your health care provider. Make sure you discuss any questions you have with your health care provider. Document Released: 06/21/2010 Document Revised: 10/25/2015 Document Reviewed: 10/20/2013 Elsevier Interactive Patient Education  2018 Elsevier Inc.  

## 2018-01-14 NOTE — Progress Notes (Signed)
Subjective:  Patient ID: Nathaniel Proctor, male    DOB: 1956-02-24  Age: 62 y.o. MRN: [id number]  CC: Follow-up   HPI Nathaniel Proctor presents for follow-up of his hypertension diabetes and elevated cholesterol with hypertriglyceridemia.  He is nonfasting today.  He has been on multiple cruises and has gained weight.  He is not exercising on a regular basis but does plan on joining a gym some he is asking for refills of his medicines.  He is having no issues with his medicines.  Blood pressure appears to be well controlled on his current regimen.  Diabetes is well controlled on the Glucophage.  He is seeing the ophthalmologist every 3 months.  He has been taking TriCor for years for his hypertriglyceridemia.  Vascular surgeon recently started him on Lipitor.  He does not smoke or use illicit drugs.  Outpatient Medications Prior to Visit  Medication Sig Dispense Refill  . aspirin 325 MG tablet Take 325 mg by mouth daily.    . Diclofenac Sodium (PENNSAID) 2 % SOLN Place 1 application onto the skin 2 (two) times daily. 1 Bottle 3  . latanoprost (XALATAN) 0.005 % ophthalmic solution Place 1 drop into both eyes at bedtime.    Marland Kitchen amLODipine-benazepril (LOTREL) 10-20 MG per capsule Take 1 capsule by mouth daily.    Marland Kitchen atorvastatin (LIPITOR) 10 MG tablet Take 10 mg by mouth daily at 6 PM.     . fenofibrate (TRICOR) 145 MG tablet Take 145 mg by mouth daily.    . metFORMIN (GLUCOPHAGE-XR) 500 MG 24 hr tablet Take 1 tablet (500 mg total) by mouth at bedtime. 90 tablet 1  . metoprolol succinate (TOPROL-XL) 100 MG 24 hr tablet Take 100 mg by mouth daily. Take with or immediately following a meal.     No facility-administered medications prior to visit.     ROS Review of Systems  Constitutional: Negative.   HENT: Negative.   Eyes: Negative.   Respiratory: Negative.   Cardiovascular: Negative.   Gastrointestinal: Negative.   Endocrine: Negative for polyphagia and polyuria.  Genitourinary: Negative for  frequency and urgency.  Musculoskeletal: Negative for gait problem and joint swelling.  Skin: Negative for pallor and rash.  Allergic/Immunologic: Negative for immunocompromised state.  Neurological: Negative for weakness, numbness and headaches.  Hematological: Does not bruise/bleed easily.  Psychiatric/Behavioral: Negative.     Objective:  BP 128/80   Pulse 77   Ht 5\' 11"  (1.803 m)   Wt 247 lb 6 oz (112.2 kg)   SpO2 94%   BMI 34.50 kg/m   BP Readings from Last 3 Encounters:  01/14/18 128/80  12/22/17 (!) 150/96  10/13/17 (!) 141/82    Wt Readings from Last 3 Encounters:  01/14/18 247 lb 6 oz (112.2 kg)  12/22/17 247 lb (112 kg)  10/13/17 249 lb (112.9 kg)    Physical Exam  Constitutional: He is oriented to person, place, and time. He appears well-developed and well-nourished. No distress.  HENT:  Head: Normocephalic and atraumatic.  Right Ear: External ear normal.  Left Ear: External ear normal.  Mouth/Throat: Oropharynx is clear and moist. No oropharyngeal exudate.  Eyes: Conjunctivae and EOM are normal. Right eye exhibits no discharge. Left eye exhibits no discharge. No scleral icterus.  Neck: Neck supple. No JVD present. No tracheal deviation present. No thyromegaly present.  Cardiovascular: Normal rate, regular rhythm and normal heart sounds.  Pulmonary/Chest: Effort normal and breath sounds normal.  Abdominal: Bowel sounds are normal.  Neurological: He is alert  and oriented to person, place, and time.  Skin: Skin is warm and dry. He is not diaphoretic.  Psychiatric: He has a normal mood and affect. His behavior is normal.    Lab Results  Component Value Date   WBC 8.3 06/08/2017   HGB 16.0 06/08/2017   HCT 47.0 06/08/2017   PLT 238 06/08/2017   GLUCOSE 101 (H) 06/08/2017   ALT 43 06/08/2017   AST 41 06/08/2017   NA 141 06/08/2017   K 3.6 06/08/2017   CL 101 06/08/2017   CREATININE 0.90 06/08/2017   BUN 12 06/08/2017   CO2 22 06/08/2017   INR 1.01  06/08/2017   HGBA1C 6.0 (H) 10/12/2017   MICROALBUR 12.7 10/12/2017    Mr Brain W CH Contrast  Result Date: 01/09/2018 CLINICAL DATA:  Hyper reflexia. Loss of hand function/weakness for 2 years. EXAM: MRI HEAD WITHOUT AND WITH CONTRAST TECHNIQUE: Multiplanar, multiecho pulse sequences of the brain and surrounding structures were obtained without and with intravenous contrast. CONTRAST:  46mL MULTIHANCE GADOBENATE DIMEGLUMINE 529 MG/ML IV SOLN COMPARISON:  CT head without contrast 06/08/2017 FINDINGS: Brain: No acute infarct, hemorrhage, or mass lesion is present. The ventricles are of normal size. No significant extraaxial fluid collection is present. Mild atrophy is within normal limits for age. No significant extracranial fluid collections are present. The internal auditory canals are within normal limits bilaterally. The brainstem and cerebellum are normal. Postcontrast images demonstrate no pathologic enhancement. Vascular: Flow is present in the major intracranial arteries. Skull and upper cervical spine: The craniocervical junction is normal. The upper cervical spine is normal. Sinuses/Orbits: The paranasal sinuses are clear. There is some fluid in the inferior mastoid air cells bilaterally. No obstructing nasopharyngeal lesion is present. IMPRESSION: Normal MRI of the brain. Electronically Signed   By: San Morelle M.D.   On: 01/09/2018 19:05    Assessment & Plan:   Nathaniel Proctor was seen today for follow-up.  Diagnoses and all orders for this visit:  Essential hypertension -     amLODipine-benazepril (LOTREL) 10-20 MG capsule; Take 1 capsule by mouth daily. -     metoprolol succinate (TOPROL-XL) 100 MG 24 hr tablet; Take 1 tablet (100 mg total) by mouth daily. Take with or immediately following a meal. -     CBC; Future -     Urinalysis, Routine w reflex microscopic; Future -     Microalbumin / creatinine urine ratio -     Comprehensive metabolic panel; Future  Stenosis of left  carotid artery -     atorvastatin (LIPITOR) 10 MG tablet; Take 1 tablet (10 mg total) by mouth daily at 6 PM. -     fenofibrate (TRICOR) 145 MG tablet; Take 1 tablet (145 mg total) by mouth daily. -     Lipid panel; Future  Controlled type 2 diabetes mellitus without complication, without long-term current use of insulin (HCC) -     amLODipine-benazepril (LOTREL) 10-20 MG capsule; Take 1 capsule by mouth daily. -     metFORMIN (GLUCOPHAGE-XR) 500 MG 24 hr tablet; Take 1 tablet (500 mg total) by mouth at bedtime. -     Hemoglobin A1c; Future -     Urinalysis, Routine w reflex microscopic; Future -     Microalbumin / creatinine urine ratio -     Comprehensive metabolic panel; Future  Elevated cholesterol with elevated triglycerides -     atorvastatin (LIPITOR) 10 MG tablet; Take 1 tablet (10 mg total) by mouth daily at 6 PM. -  fenofibrate (TRICOR) 145 MG tablet; Take 1 tablet (145 mg total) by mouth daily. -     CBC; Future -     Lipid panel; Future -     Comprehensive metabolic panel; Future  Encounter for hepatitis C screening test for low risk patient -     Hepatitis C antibody; Future   I have changed Nathaniel Proctor amLODipine-benazepril, atorvastatin, fenofibrate, and metoprolol succinate. I am also having him maintain his latanoprost, Diclofenac Sodium, aspirin, and metFORMIN.  Meds ordered this encounter  Medications  . amLODipine-benazepril (LOTREL) 10-20 MG capsule    Sig: Take 1 capsule by mouth daily.    Dispense:  90 capsule    Refill:  1  . atorvastatin (LIPITOR) 10 MG tablet    Sig: Take 1 tablet (10 mg total) by mouth daily at 6 PM.    Dispense:  90 tablet    Refill:  1  . fenofibrate (TRICOR) 145 MG tablet    Sig: Take 1 tablet (145 mg total) by mouth daily.    Dispense:  90 tablet    Refill:  1  . metFORMIN (GLUCOPHAGE-XR) 500 MG 24 hr tablet    Sig: Take 1 tablet (500 mg total) by mouth at bedtime.    Dispense:  90 tablet    Refill:  1  . metoprolol  succinate (TOPROL-XL) 100 MG 24 hr tablet    Sig: Take 1 tablet (100 mg total) by mouth daily. Take with or immediately following a meal.    Dispense:  90 tablet    Refill:  1   Patient will return fasting early in September for his blood work.  Stressed the importance of losing weight for all of his health issues.  He was given anticipatory guidance on diabetes control and weight loss.  Suggested follow-up will pending results of blood work.  Follow-up: Return in about 6 months (around 07/17/2018), or return for fasting blood work in september. Libby Maw, MD

## 2018-01-15 ENCOUNTER — Ambulatory Visit: Payer: No Typology Code available for payment source | Admitting: Family Medicine

## 2018-01-22 ENCOUNTER — Ambulatory Visit: Payer: No Typology Code available for payment source | Admitting: Family Medicine

## 2018-02-02 ENCOUNTER — Ambulatory Visit (INDEPENDENT_AMBULATORY_CARE_PROVIDER_SITE_OTHER): Payer: No Typology Code available for payment source | Admitting: Neurology

## 2018-02-02 DIAGNOSIS — M79602 Pain in left arm: Secondary | ICD-10-CM

## 2018-02-02 NOTE — Procedures (Signed)
[facility]  [place], [place]  [place], [place] [address] Tel: [phone] Fax:  [phone] Test Date:  02/02/2018  Patient: Nathaniel Proctor DOB: 1955/07/27 Physician: Narda Amber, DO  Sex: Male Height: 5\' 11"  Ref Phys: Metta Clines, DO  ID#: [id number] Temp: 34.6C Technician:    Patient Complaints: This is a 62 year-old man referred for evaluation of left arm pain and involuntary movement.  NCV & EMG Findings: Extensive electrodiagnostic testing of the left upper extremity shows:  1. All sensory responses including the left median, ulnar, mixed palmer, medial and lateral antebrachial cutaneous sensory responses are within normal limits.  2. Left median and ulnar motor responses are within normal limits. 3. There is no evidence of active or chronic motor axon loss changes affecting any of the tested muscles. Motor unit configuration and recruitment pattern is within normal limits.  Impression: This is a normal study of the left upper extremity. In particular, there is no evidence of a cervical radiculopathy or carpal tunnel syndrome.   ___________________________ Narda Amber, DO    Nerve Conduction Studies Anti Sensory Summary Table   Site NR Peak (ms) Norm Peak (ms) P-T Amp (V) Norm P-T Amp  Left Lat Ante Brach Cutan Anti Sensory (Lat Forearm)  34.6C  Lat Biceps    1.9 <2.8 8.3   Left Med Ante Brach Cutan Anti Sensory (Med Forearm)  34.6C  Elbow    3.3  8.5   Left Median Anti Sensory (2nd Digit)  34.6C  Wrist    3.1 <3.8 26.2 >10  Left Ulnar Anti Sensory (5th Digit)  34.6C  Wrist    2.8 <3.2 15.1 >5   Motor Summary Table   Site NR Onset (ms) Norm Onset (ms) O-P Amp (mV) Norm O-P Amp Site1 Site2 Delta-0 (ms) Dist (cm) Vel (m/s) Norm Vel (m/s)  Left Median Motor (Abd Poll Brev)  34.6C  Wrist    3.2 <4.0 7.0 >5 Elbow Wrist 5.6 31.0 55 >50  Elbow    8.8  6.9         Left Ulnar Motor (Abd Dig Minimi)  34.6C  Wrist    2.0 <3.1 9.8 >7 B Elbow Wrist  4.2 26.0 62 >50  B Elbow    6.2  7.8  A Elbow B Elbow 1.9 10.0 53 >50  A Elbow    8.1  7.8          Comparison Summary Table   Site NR Peak (ms) Norm Peak (ms) P-T Amp (V) Site1 Site2 Delta-P (ms) Norm Delta (ms)  Left Median/Ulnar Palm Comparison (Wrist - 8cm)  34.6C  Median Palm    1.6 <2.2 85.2 Median Palm Ulnar Palm 0.2   Ulnar Palm    1.4 <2.2 10.0       EMG   Side Muscle Ins Act Fibs Psw Fasc Number Recrt Dur Dur. Amp Amp. Poly Poly. Comment  Left 1stDorInt Nml Nml Nml Nml Nml Nml Nml Nml Nml Nml Nml Nml N/A  Left PronatorTeres Nml Nml Nml Nml Nml Nml Nml Nml Nml Nml Nml Nml N/A  Left Biceps Nml Nml Nml Nml Nml Nml Nml Nml Nml Nml Nml Nml N/A  Left Triceps Nml Nml Nml Nml Nml Nml Nml Nml Nml Nml Nml Nml N/A  Left Deltoid Nml Nml Nml Nml Nml Nml Nml Nml Nml Nml Nml Nml N/A  Left ABD Dig Min Nml Nml Nml Nml Nml Nml Nml Nml Nml Nml Nml Nml N/A  Waveforms:

## 2018-02-04 ENCOUNTER — Telehealth: Payer: Self-pay | Admitting: Family Medicine

## 2018-02-04 ENCOUNTER — Telehealth: Payer: Self-pay | Admitting: Neurology

## 2018-02-04 DIAGNOSIS — M79602 Pain in left arm: Secondary | ICD-10-CM

## 2018-02-04 NOTE — Telephone Encounter (Signed)
Please advise 

## 2018-02-04 NOTE — Telephone Encounter (Signed)
Dr. Jaffe patient 

## 2018-02-04 NOTE — Telephone Encounter (Signed)
Informed patient's wife of his results.   Rosemarie Ax, MD [facility] 02/04/2018, 9:06 AM

## 2018-02-04 NOTE — Telephone Encounter (Signed)
Patient calling trying to get test results and MRI results. He has some questions on what to do next. Please call him back at [phone]. Thanks.

## 2018-02-05 NOTE — Telephone Encounter (Signed)
The testing shows no nerve problems in the neck or arm.  He probably has dystonia which is uncontrolled contraction of his muscles.  This is typically treated with Botox.  We can refer him to Dr. Alysia Penna at [facility], for evaluation and possible treatment with Botox.

## 2018-02-09 NOTE — Telephone Encounter (Signed)
Called and spoke with Pt. He was agreeable for evaluate and treatwith Dr Letta Pate

## 2018-02-09 NOTE — Addendum Note (Signed)
Addended by: Clois Comber on: 02/09/2018 03:40 PM   Modules accepted: Orders

## 2018-02-16 ENCOUNTER — Encounter: Payer: No Typology Code available for payment source | Attending: Physical Medicine & Rehabilitation

## 2018-02-16 ENCOUNTER — Ambulatory Visit: Payer: No Typology Code available for payment source | Admitting: Physical Medicine & Rehabilitation

## 2018-02-16 ENCOUNTER — Encounter: Payer: Self-pay | Admitting: Physical Medicine & Rehabilitation

## 2018-02-16 VITALS — BP 178/80 | HR 63 | Resp 14 | Ht 71.0 in | Wt 244.0 lb

## 2018-02-16 DIAGNOSIS — E119 Type 2 diabetes mellitus without complications: Secondary | ICD-10-CM | POA: Diagnosis not present

## 2018-02-16 DIAGNOSIS — Z7984 Long term (current) use of oral hypoglycemic drugs: Secondary | ICD-10-CM | POA: Diagnosis not present

## 2018-02-16 DIAGNOSIS — E7800 Pure hypercholesterolemia, unspecified: Secondary | ICD-10-CM | POA: Diagnosis not present

## 2018-02-16 DIAGNOSIS — H409 Unspecified glaucoma: Secondary | ICD-10-CM | POA: Insufficient documentation

## 2018-02-16 DIAGNOSIS — Z7982 Long term (current) use of aspirin: Secondary | ICD-10-CM | POA: Diagnosis not present

## 2018-02-16 DIAGNOSIS — Z8249 Family history of ischemic heart disease and other diseases of the circulatory system: Secondary | ICD-10-CM | POA: Diagnosis not present

## 2018-02-16 DIAGNOSIS — M79642 Pain in left hand: Secondary | ICD-10-CM | POA: Diagnosis not present

## 2018-02-16 DIAGNOSIS — F329 Major depressive disorder, single episode, unspecified: Secondary | ICD-10-CM | POA: Insufficient documentation

## 2018-02-16 DIAGNOSIS — G248 Other dystonia: Secondary | ICD-10-CM | POA: Diagnosis not present

## 2018-02-16 DIAGNOSIS — M79602 Pain in left arm: Secondary | ICD-10-CM | POA: Diagnosis not present

## 2018-02-16 DIAGNOSIS — M7552 Bursitis of left shoulder: Secondary | ICD-10-CM | POA: Diagnosis not present

## 2018-02-16 DIAGNOSIS — Z79899 Other long term (current) drug therapy: Secondary | ICD-10-CM | POA: Insufficient documentation

## 2018-02-16 DIAGNOSIS — Z87891 Personal history of nicotine dependence: Secondary | ICD-10-CM | POA: Diagnosis not present

## 2018-02-16 DIAGNOSIS — I10 Essential (primary) hypertension: Secondary | ICD-10-CM | POA: Insufficient documentation

## 2018-02-16 NOTE — Progress Notes (Signed)
Subjective:    Patient ID: Nathaniel Proctor, male    DOB: Jun 17, 1955, 62 y.o.   MRN: [id number]  HPI  [id number] Left hand grip  62 yo Right sided male referred by neurology< Dr Tomi Likens, for the evaluation of dystonia.  The patient states that over the last 2 to 3 years he has had problems with grasping objects with his left hand.  If he grabs a Styrofoam cup he would crush it even if it had hot coffee in it.  He had similar problems with open cans.  In addition he has sensation of shooting pain in the left hand when he hits a golf ball. The patient is retired and denies any hobbies that involve repetitive use. He has had evaluation in the emergency department in January 2019, CT head was unremarkable.  He followed up with sports medicine for left upper extremity pain as well as shoulder pain, ultrasound of left upper extremity at the shoulder limited demonstrates subacromial bursitis supraspinatus tendinopathy Left forearm open fracture ~20 yrs ago, but has no pain in wrist The patient states that over the last 2 years however his wrist range of motion has been diminishing on the left side  No problems on the right side  MRI brain normal 01/09/18 MRI C Spine C 6-7 DDD 12/30/2017 EMG/NCV normal left upper extremity 02/02/2018  No history of myotonic dystrophy  Pain Inventory Average Pain 0 Pain Right Now 0 My pain is intermittent and stinging  In the last 24 hours, has pain interfered with the following? General activity 0 Relation with others 0 Enjoyment of life 0 What TIME of day is your pain at its worst? varies with activity Sleep (in general) Good  Pain is worse with: some activites Pain improves with: nonoe Relief from Meds: 0  Mobility walk without assistance  Function not employed: date last employed .  Neuro/Psych weakness numbness tingling  Prior Studies new  Physicians involved in your care new   Family History  Problem Relation Age of Onset  . Cancer  Mother   . Early death Mother 43  . COPD Father   . Stroke Father   . Drug abuse Brother   . Heart attack Maternal Grandmother   . Heart attack Maternal Grandfather    Social History   Socioeconomic History  . Marital status: Married    Spouse name: Bonnita Nasuti  . Number of children: 2  . Years of education: Not on file  . Highest education level: Bachelor's degree (e.g., BA, AB, BS)  Occupational History  . Occupation: retired  Scientific laboratory technician  . Financial resource strain: Not on file  . Food insecurity:    Worry: Not on file    Inability: Not on file  . Transportation needs:    Medical: Not on file    Non-medical: Not on file  Tobacco Use  . Smoking status: Former Smoker    Types: Cigarettes, Cigars    Last attempt to quit: 02/21/2011    Years since quitting: 6.9  . Smokeless tobacco: Never Used  Substance and Sexual Activity  . Alcohol use: Yes    Comment: wine  occ.  . Drug use: No  . Sexual activity: Not on file  Lifestyle  . Physical activity:    Days per week: Not on file    Minutes per session: Not on file  . Stress: Not on file  Relationships  . Social connections:    Talks on phone: Not on file  Gets together: Not on file    Attends religious service: Not on file    Active member of club or organization: Not on file    Attends meetings of clubs or organizations: Not on file    Relationship status: Not on file  Other Topics Concern  . Not on file  Social History Narrative   Patient is right-handed. He lives with his wife ina 2 story house. He drinks 4-5 cups of coffee a day, and an occasional glass of tea. He participates in Yoga on Tu and Th, and water aerobics on Mo, We and Fr, all for 60 minutes.   Past Surgical History:  Procedure Laterality Date  . COLONOSCOPY WITH PROPOFOL N/A 11/27/2014   Procedure: COLONOSCOPY WITH PROPOFOL;  Surgeon: Garlan Fair, MD;  Location: [facility];  Service: Endoscopy;  Laterality: N/A;  . Left wrist tendon repair  Left 2000   [place]  . VASECTOMY     Past Medical History:  Diagnosis Date  . Allergy   . Depression   . Glaucoma   . Hx of seasonal allergies   . Hypercholesterolemia   . Hypertension    BP (!) 178/80 (BP Location: Right Arm, Patient Position: Sitting, Cuff Size: Normal)   Pulse 63   Resp 14   Ht 5\' 11"  (1.803 m)   Wt 244 lb (110.7 kg)   SpO2 95%   BMI 34.03 kg/m   Opioid Risk Score:   Fall Risk Score:  `1  Depression screen PHQ 2/9  No flowsheet data found.  Review of Systems  Constitutional: Negative.   HENT: Negative.   Eyes: Negative.   Respiratory: Negative.   Cardiovascular: Negative.   Gastrointestinal: Negative.   Endocrine: Negative.   Genitourinary: Negative.   Musculoskeletal: Negative.   Skin: Negative.   Allergic/Immunologic: Negative.   Neurological: Negative.   Hematological: Negative.   Psychiatric/Behavioral: Negative.        Objective:   Physical Exam  Constitutional: He is oriented to person, place, and time. He appears well-developed and well-nourished. No distress.  HENT:  Head: Normocephalic and atraumatic.  Eyes: Pupils are equal, round, and reactive to light. EOM are normal.  Cardiovascular: Normal rate and regular rhythm. Exam reveals no friction rub.  No murmur heard. Pulmonary/Chest: Effort normal and breath sounds normal. No respiratory distress.  Abdominal: Soft. Bowel sounds are normal. He exhibits no distension. There is no tenderness.  Musculoskeletal:  + impingement  Left shoulder    Neurological: He is alert and oriented to person, place, and time. He has normal strength. Coordination abnormal. Gait normal.  Decreased finger to thumb opposition left hand Mild disc diadochokinesis left upper extremity No dysmetria finger-nose-finger testing No percussion myotonia in the left APB  Motor strength is 5/5 bilateral deltoid bicep tricep grip hip flexion extensor ankle dorsiflexion Gait not evidence of toe drag or  knee instability Speech without evidence of dysarthria or aphasia  Skin: Skin is warm and dry. He is not diaphoretic.  Psychiatric: He has a normal mood and affect.  Nursing note and vitals reviewed.  Tone MAS 0 in the finger flexors wrist flexors.  Healed surgical incisions dorsal aspect of the distal and proximal forearm Negative Tinel's over the carpal tunnel     Assessment & Plan:  1.  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  We discussed that this is not a problem in the fingers themselves however it is a problem of controlling movements that  originates from the brain. We discussed that the stinging sensation in the hand that he has when he hits the golf ball is likely a separate issue We discussed treatment with botulinum toxin and he would like to think about this and get some more literature and do some research on his own about this.  We discussed that the botulinum toxin would likely not relieve some of the stinging sensation while hitting golf ball.  It would also slightly weak in his grasp on the left side.  We discussed that the usual duration of response to Botox is approximately 3 months We will see me back in 2 months if he decides not to pursue the botulinum toxin injections, Would start with a total dose of 100 units in place 25 units into the FDS 25 units into the FDP 25 units FCU, 25 units FCR Other alternatives to treatment may include low-dose levodopa carbidopa

## 2018-02-16 NOTE — Patient Instructions (Addendum)
Focal Dystonia Dystonia is a condition that makes your muscles contract without warning (muscle spasms). It can make doing everyday tasks hard. There are different forms of dystonia. The condition can affect just one part of your body, or it can affect larger areas of your body. Dystonia affects people in different ways. In some people, it is mild and goes away over time, while others may need treatment. Although there is no cure for dystonia, you can manage the condition with treatment. What are the causes? Dystonia may be caused by:  Genetics. This means you inherited the genes that cause you to be at risk for dystonia.  An abnormality in the part of your brain that controls movement (basal ganglia).  Dystonia may also be acquired. If you have acquired dystonia, you developed the condition after:  Brain injury.  Infection.  Drug reaction.  The cause of dystonia may also not be known (idiopathic dystonia). What are the signs or symptoms? Signs and symptoms of dystonia can depend on which type of the condition you have. Common signs and symptoms include:  Muscle twitches or spasms around your eyes (blepharospasm).  Foot cramping or dragging.  Pulling of your neck to one side (torticollis).  Muscles spasms of the face.  Spasms of the voice box.  Tremors.  Awkward and painful positions.  Muscle cramping after muscle use.  How is this diagnosed? Your health care provider can diagnose dystonia based on your symptoms and medical history. Your health care provider will also do a physical exam. You may also have:  A blood test to check for genes that cause dystonia.  Brain imaging tests to rule out other causes of your symptoms.  There are no tests that can diagnose other causes of dystonia. How is this treated? There are no treatments that can cure or prevent dystonia. Treatment to manage dystonia may include:  Injecting the affected muscles with a chemical (botulinum) that  blocks muscle spasms. This treatment can block spasms for a few days to a few months.  Medicines to relax muscles.  Follow these instructions at home:  Physical therapy to improve muscle strength and movement may be suggested by your health care provider. Continue your physical therapy exercises at home as instructed by your physical therapist.  Make sure you have a good support system. Let your health care provider know if you are struggling with stress or anxiety.  Keep all follow-up visits as directed by your health care provider. This is important.  Take medicines only as directed by your health care provider. Contact a health care provider if:  Your condition is changing or getting worse.  You need more support at home. This information is not intended to replace advice given to you by your health care provider. Make sure you discuss any questions you have with your health care provider. Document Released: 05/09/2002 Document Revised: 10/25/2015 Document Reviewed: 07/13/2013 Elsevier Interactive Patient Education  [employer].

## 2018-02-24 ENCOUNTER — Other Ambulatory Visit (INDEPENDENT_AMBULATORY_CARE_PROVIDER_SITE_OTHER): Payer: No Typology Code available for payment source

## 2018-02-24 DIAGNOSIS — I10 Essential (primary) hypertension: Secondary | ICD-10-CM

## 2018-02-24 DIAGNOSIS — E119 Type 2 diabetes mellitus without complications: Secondary | ICD-10-CM

## 2018-02-24 DIAGNOSIS — I6522 Occlusion and stenosis of left carotid artery: Secondary | ICD-10-CM

## 2018-02-24 DIAGNOSIS — Z1159 Encounter for screening for other viral diseases: Secondary | ICD-10-CM

## 2018-02-24 DIAGNOSIS — E782 Mixed hyperlipidemia: Secondary | ICD-10-CM

## 2018-02-24 NOTE — Addendum Note (Signed)
Addended by: Diona Foley on: 02/24/2018 09:29 AM   Modules accepted: Orders

## 2018-02-25 LAB — COMPREHENSIVE METABOLIC PANEL
ALT: 31 IU/L (ref 0–44)
AST: 28 IU/L (ref 0–40)
Albumin/Globulin Ratio: 1.7 (ref 1.2–2.2)
Albumin: 4.4 g/dL (ref 3.6–4.8)
Alkaline Phosphatase: 57 IU/L (ref 39–117)
BUN/Creatinine Ratio: 10 (ref 10–24)
BUN: 10 mg/dL (ref 8–27)
Bilirubin Total: 0.5 mg/dL (ref 0.0–1.2)
CALCIUM: 9.1 mg/dL (ref 8.6–10.2)
CHLORIDE: 101 mmol/L (ref 96–106)
CO2: 25 mmol/L (ref 20–29)
Creatinine, Ser: 0.99 mg/dL (ref 0.76–1.27)
GFR calc Af Amer: 93 mL/min/{1.73_m2} (ref >59)
GFR calc non Af Amer: 81 mL/min/{1.73_m2} (ref >59)
GLUCOSE: 119 mg/dL — AB (ref 65–99)
Globulin, Total: 2.6 g/dL (ref 1.5–4.5)
Potassium: 4.4 mmol/L (ref 3.5–5.2)
Sodium: 142 mmol/L (ref 134–144)
Total Protein: 7 g/dL (ref 6.0–8.5)

## 2018-02-25 LAB — CBC
Hematocrit: 43.7 % (ref 37.5–51.0)
Hemoglobin: 14.9 g/dL (ref 13.0–17.7)
MCH: 31.8 pg (ref 26.6–33.0)
MCHC: 34.1 g/dL (ref 31.5–35.7)
MCV: 93 fL (ref 79–97)
PLATELETS: 281 10*3/uL (ref 150–450)
RBC: 4.68 x10E6/uL (ref 4.14–5.80)
RDW: 11.4 % — ABNORMAL LOW (ref 12.3–15.4)
WBC: 6.3 10*3/uL (ref 3.4–10.8)

## 2018-02-25 LAB — MICROALBUMIN / CREATININE URINE RATIO
Creatinine, Urine: 79.9 mg/dL
Microalb/Creat Ratio: 31.7 mg/g creat — ABNORMAL HIGH (ref 0.0–30.0)
Microalbumin, Urine: 25.3 ug/mL

## 2018-02-25 LAB — LIPID PANEL
CHOL/HDL RATIO: 3.5 ratio (ref 0.0–5.0)
Cholesterol, Total: 144 mg/dL (ref 100–199)
HDL: 41 mg/dL (ref >39)
LDL CALC: 75 mg/dL (ref 0–99)
TRIGLYCERIDES: 138 mg/dL (ref 0–149)
VLDL Cholesterol Cal: 28 mg/dL (ref 5–40)

## 2018-02-25 LAB — HEMOGLOBIN A1C
Est. average glucose Bld gHb Est-mCnc: 123 mg/dL
HEMOGLOBIN A1C: 5.9 % — AB (ref 4.8–5.6)

## 2018-02-25 LAB — URINALYSIS, ROUTINE W REFLEX MICROSCOPIC
Bilirubin, UA: NEGATIVE
GLUCOSE, UA: NEGATIVE
Ketones, UA: NEGATIVE
Leukocytes, UA: NEGATIVE
Nitrite, UA: NEGATIVE
PH UA: 7.5 (ref 5.0–7.5)
PROTEIN UA: NEGATIVE
RBC UA: NEGATIVE
Specific Gravity, UA: 1.009 (ref 1.005–1.030)
Urobilinogen, Ur: 1 mg/dL (ref 0.2–1.0)

## 2018-02-25 LAB — HEPATITIS C ANTIBODY

## 2018-04-02 ENCOUNTER — Other Ambulatory Visit: Payer: Self-pay | Admitting: Family Medicine

## 2018-04-02 DIAGNOSIS — E119 Type 2 diabetes mellitus without complications: Secondary | ICD-10-CM

## 2018-04-10 IMAGING — CT CT HEAD W/O CM
4 series · 16 of 47 positions shown, 18 images · non-contrast
Comparison: CT 02/18/2017.

CLINICAL DATA: Left-sided weakness and slurred speech. Known left
carotid blockage.

EXAM:
CT HEAD WITHOUT CONTRAST
TECHNIQUE: Contiguous axial images were obtained from the base of the skull
through the vertex without intravenous contrast.

[Series 3: head without · axial · non-contrast · 0.44mm/px · z∈[-24,+96]mm · 7 of 32 slices shown, 9 images]
[im 4/32  brain]
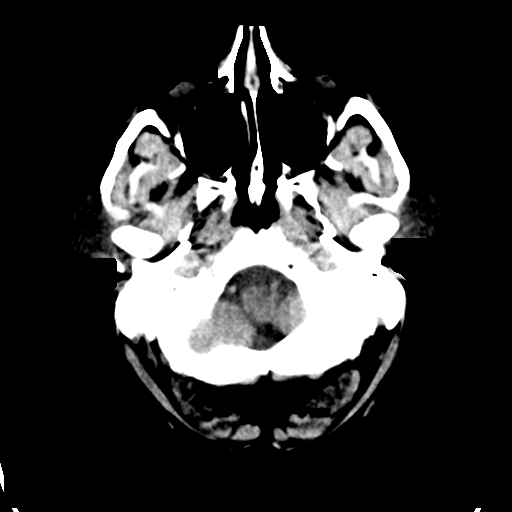
[im 4/32  bone]
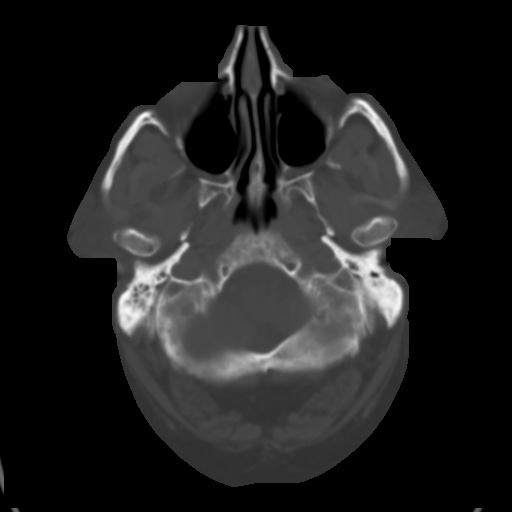
[im 8/32  brain]
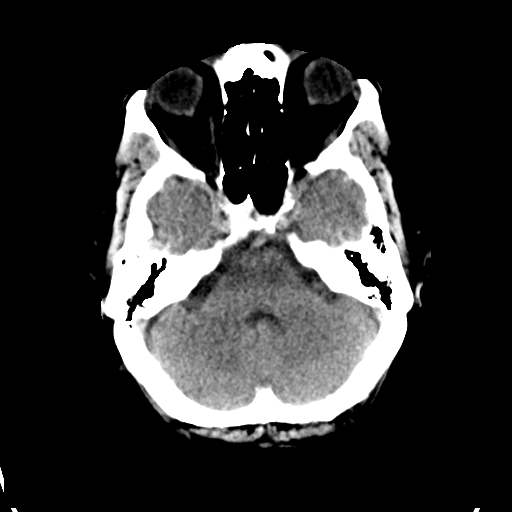
[im 12/32  brain]
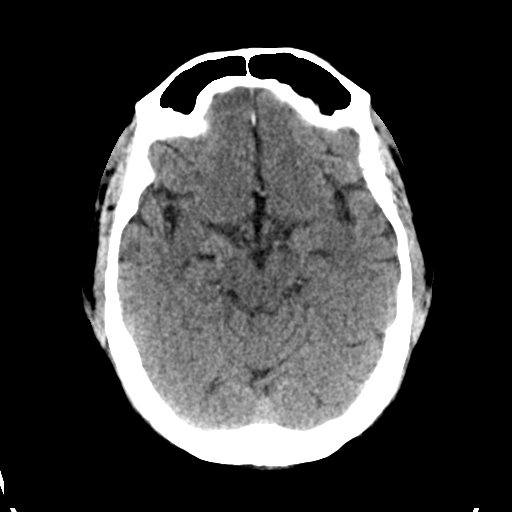
[im 16/32  brain]
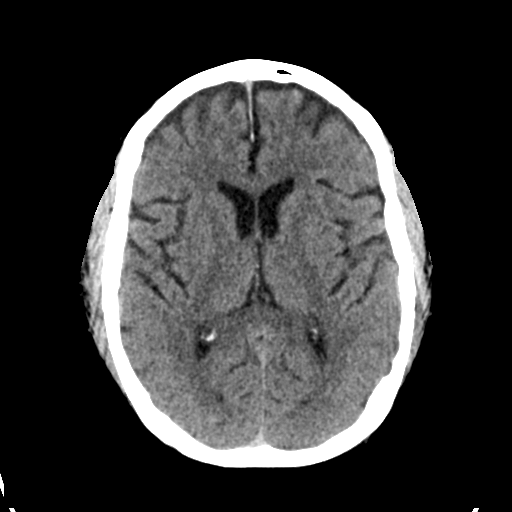
[im 20/32  brain]
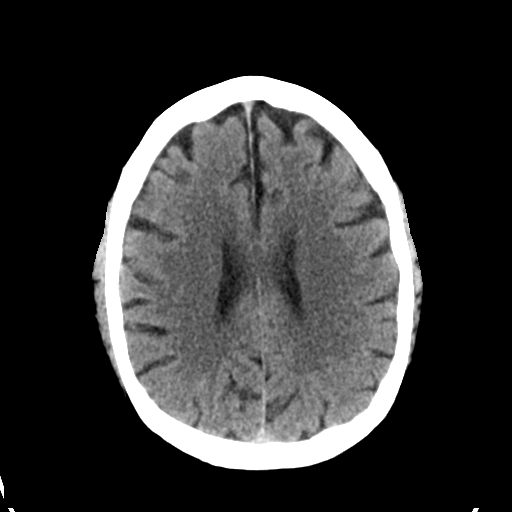
[im 20/32  bone]
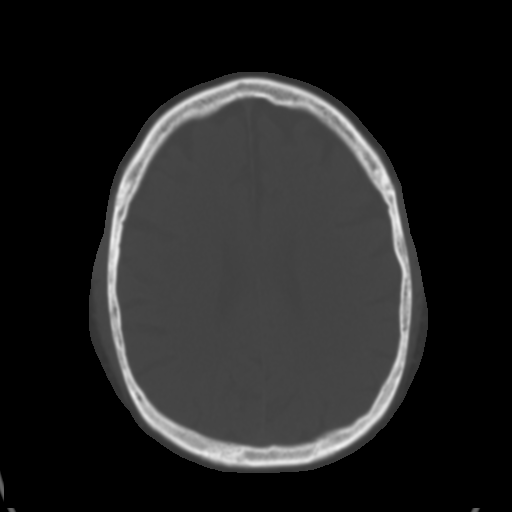
[im 24/32  brain]
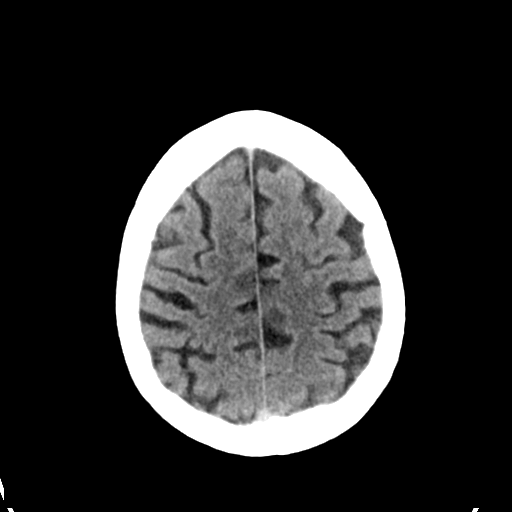
[im 28/32  brain]
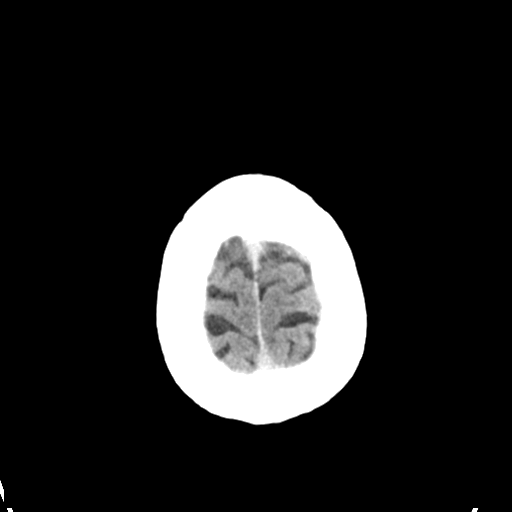

[Series 4: head bone · axial · 0.44mm/px · z∈[-25,+7]mm · 3 of 79 slices shown]
[im 8/79  bone]
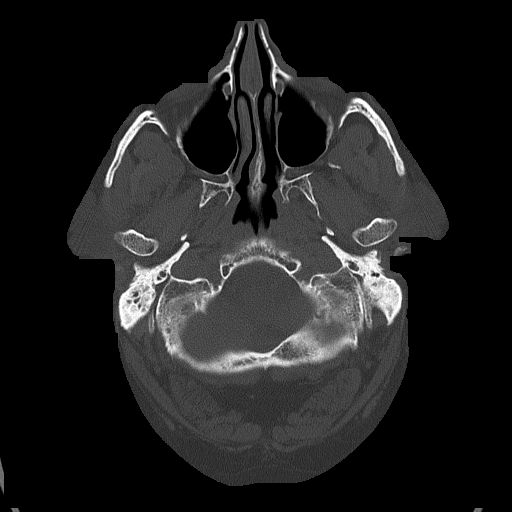
[im 16/79  bone]
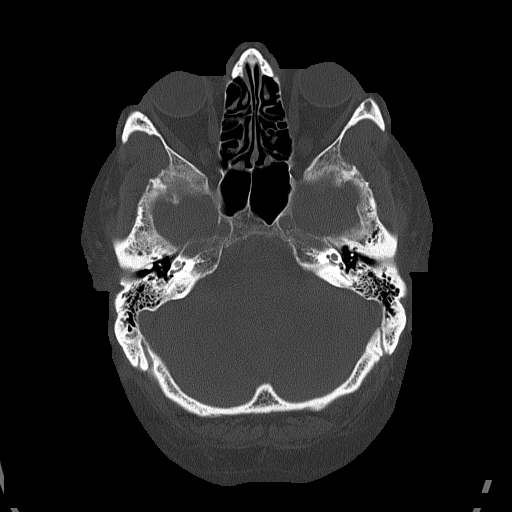
[im 24/79  bone]
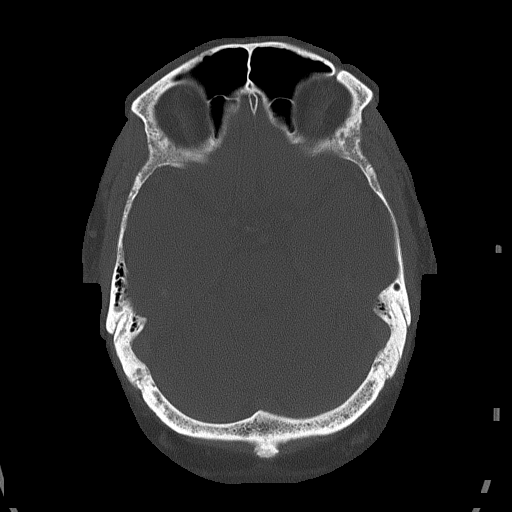

[Series 5: head without cor · coronal · non-contrast · 0.30mm/px · 3 of 67 slices shown]
[im 23/67  brain]
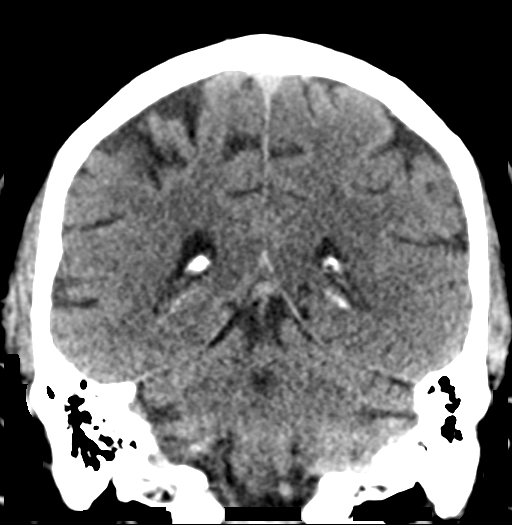
[im 30/67  brain]
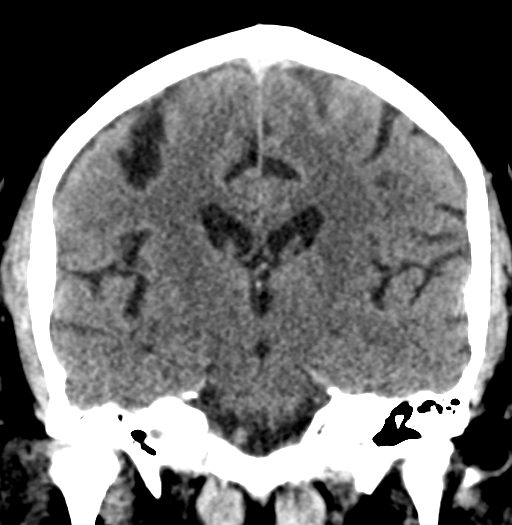
[im 37/67  brain]
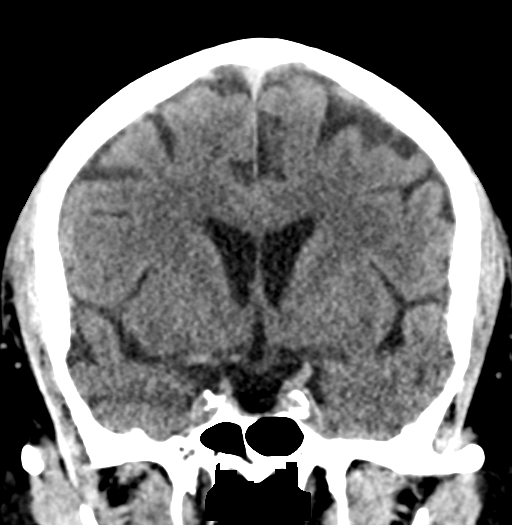

[Series 6: head without sag · sagittal · non-contrast · 0.31mm/px · 3 of 67 slices shown]
[im 23/67  brain]
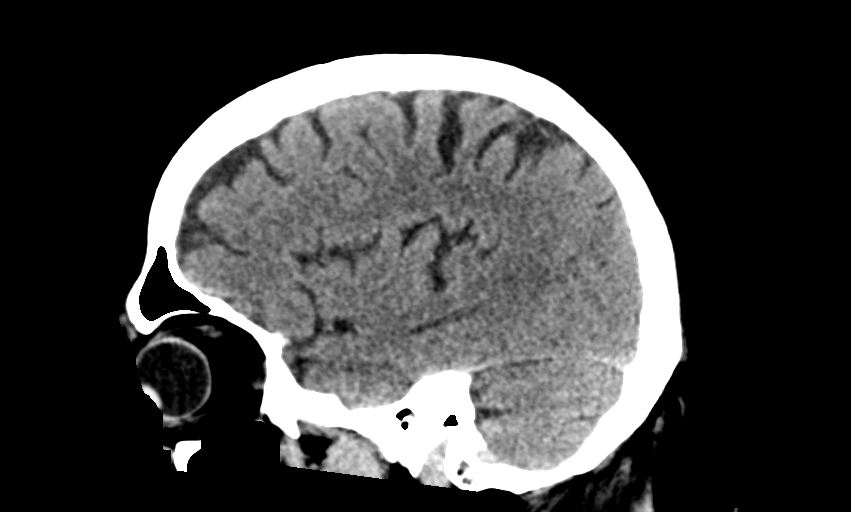
[im 34/67  brain]
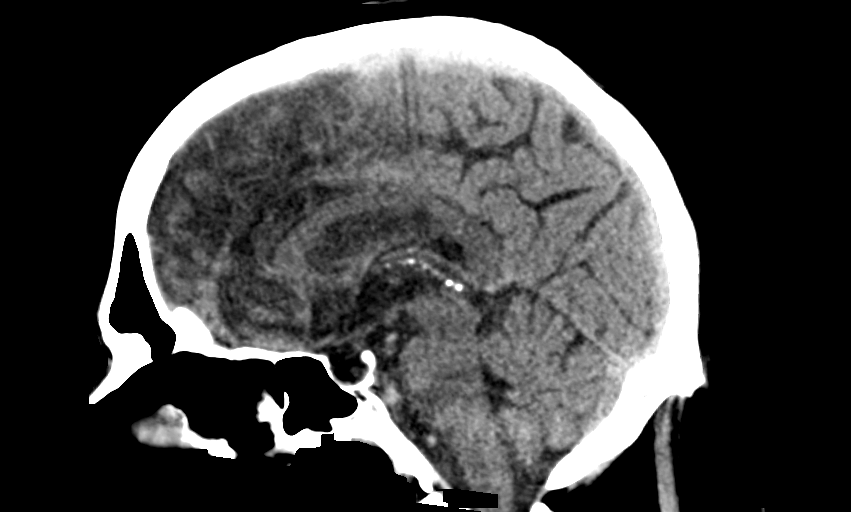
[im 45/67  brain]
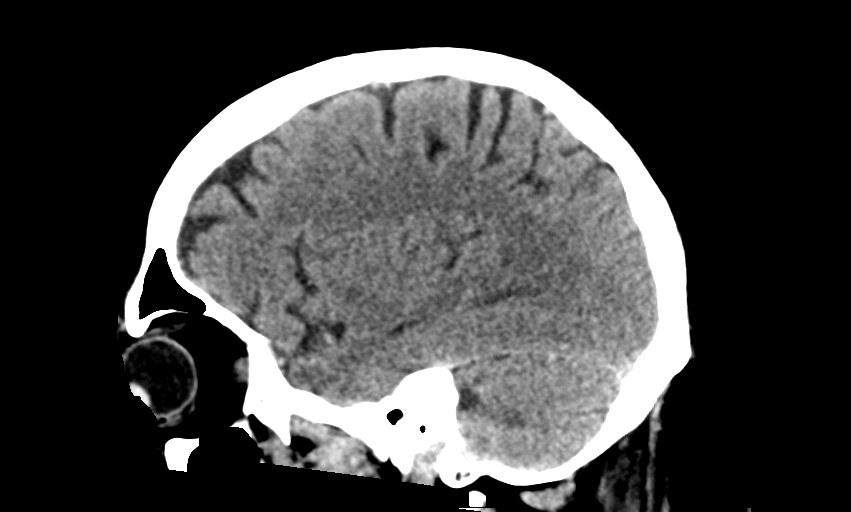

[16 of 47 positions shown; findings below may reference images not displayed]

FINDINGS: Brain: There is no evidence of acute intracranial hemorrhage, mass
lesion, brain edema or extra-axial fluid collection. There is mild
generalized atrophy with prominence of the ventricles and
subarachnoid spaces. There is no CT evidence of acute cortical
infarction.

Vascular: Intracranial vascular calcifications. No hyperdense vessel
identified.

Skull: Negative for fracture or focal lesion.

Sinuses/Orbits: The visualized paranasal sinuses and mastoid air
cells are clear. No orbital abnormalities are seen.

Other: None.
IMPRESSION: Stable head CT.  No acute intracranial findings.

## 2018-04-14 ENCOUNTER — Telehealth: Payer: Self-pay | Admitting: Physical Medicine & Rehabilitation

## 2018-04-14 NOTE — Telephone Encounter (Signed)
Last clinic note indicates that the decision to have Botox is totally up to the patient.  Clinic visit switched to Botox injection

## 2018-04-14 NOTE — Telephone Encounter (Signed)
PTN WANTS TO DO BOTOX AT NEXT APPT - PLEASE ADVISE IF THAT'S OKAY

## 2018-05-03 ENCOUNTER — Encounter: Payer: Self-pay | Admitting: Physical Medicine & Rehabilitation

## 2018-05-03 ENCOUNTER — Ambulatory Visit (HOSPITAL_BASED_OUTPATIENT_CLINIC_OR_DEPARTMENT_OTHER): Payer: No Typology Code available for payment source | Admitting: Physical Medicine & Rehabilitation

## 2018-05-03 ENCOUNTER — Encounter: Payer: No Typology Code available for payment source | Attending: Physical Medicine & Rehabilitation

## 2018-05-03 VITALS — BP 169/95 | HR 59 | Resp 14 | Ht 71.0 in | Wt 252.0 lb

## 2018-05-03 DIAGNOSIS — I10 Essential (primary) hypertension: Secondary | ICD-10-CM | POA: Insufficient documentation

## 2018-05-03 DIAGNOSIS — Z87891 Personal history of nicotine dependence: Secondary | ICD-10-CM | POA: Insufficient documentation

## 2018-05-03 DIAGNOSIS — G248 Other dystonia: Secondary | ICD-10-CM | POA: Insufficient documentation

## 2018-05-03 DIAGNOSIS — M79602 Pain in left arm: Secondary | ICD-10-CM | POA: Diagnosis not present

## 2018-05-03 DIAGNOSIS — Z79899 Other long term (current) drug therapy: Secondary | ICD-10-CM | POA: Diagnosis not present

## 2018-05-03 DIAGNOSIS — Z7982 Long term (current) use of aspirin: Secondary | ICD-10-CM | POA: Diagnosis not present

## 2018-05-03 DIAGNOSIS — M79642 Pain in left hand: Secondary | ICD-10-CM | POA: Diagnosis not present

## 2018-05-03 DIAGNOSIS — E119 Type 2 diabetes mellitus without complications: Secondary | ICD-10-CM | POA: Insufficient documentation

## 2018-05-03 DIAGNOSIS — H409 Unspecified glaucoma: Secondary | ICD-10-CM | POA: Insufficient documentation

## 2018-05-03 DIAGNOSIS — Z8249 Family history of ischemic heart disease and other diseases of the circulatory system: Secondary | ICD-10-CM | POA: Insufficient documentation

## 2018-05-03 DIAGNOSIS — F329 Major depressive disorder, single episode, unspecified: Secondary | ICD-10-CM | POA: Diagnosis not present

## 2018-05-03 DIAGNOSIS — Z7984 Long term (current) use of oral hypoglycemic drugs: Secondary | ICD-10-CM | POA: Insufficient documentation

## 2018-05-03 DIAGNOSIS — M7552 Bursitis of left shoulder: Secondary | ICD-10-CM | POA: Diagnosis not present

## 2018-05-03 DIAGNOSIS — E7800 Pure hypercholesterolemia, unspecified: Secondary | ICD-10-CM | POA: Diagnosis not present

## 2018-05-03 NOTE — Progress Notes (Signed)
Botox Injection for spasticity using needle EMG guidance  Dilution: 50 Units/ml Indication: Severe spasticity which interferes with ADL,mobility and/or  hygiene and is unresponsive to medication management and other conservative care Informed consent was obtained after describing risks and benefits of the procedure with the patient. This includes bleeding, bruising, infection, excessive weakness, or medication side effects. A REMS form is on file and signed. Needle: 27g 1" needle electrode Number of units per muscle   FCR25 FCU25 FDS25 FDP25  All injections were done after obtaining appropriate EMG activity and after negative drawback for blood. The patient tolerated the procedure well. Post procedure instructions were given. A followup appointment was made.

## 2018-06-07 ENCOUNTER — Other Ambulatory Visit: Payer: Self-pay

## 2018-06-07 DIAGNOSIS — E119 Type 2 diabetes mellitus without complications: Secondary | ICD-10-CM

## 2018-06-07 MED ORDER — METFORMIN HCL ER 500 MG PO TB24
500.0000 mg | ORAL_TABLET | Freq: Every day | ORAL | 1 refills | Status: DC
Start: 2018-06-07 — End: 2018-08-17

## 2018-06-15 ENCOUNTER — Telehealth: Payer: Self-pay | Admitting: Physical Medicine & Rehabilitation

## 2018-06-15 NOTE — Telephone Encounter (Signed)
Please refer this to [facility].  We do pre-authorizations on all of our Botox.  There is also patient assistance from [employer] that can be utilized.  He just needs to get some help with this.

## 2018-06-15 NOTE — Telephone Encounter (Signed)
Patient was upset due to the fact he stated the bill came in a month after his appt and that now he has to reschedule because of his insurance just receiving the bill....stated the bill was $1700 and could not pay that OOP.

## 2018-06-21 ENCOUNTER — Ambulatory Visit: Payer: No Typology Code available for payment source | Admitting: Physical Medicine & Rehabilitation

## 2018-08-02 ENCOUNTER — Ambulatory Visit: Payer: No Typology Code available for payment source

## 2018-08-02 ENCOUNTER — Encounter: Payer: Self-pay | Admitting: Family Medicine

## 2018-08-02 ENCOUNTER — Ambulatory Visit: Payer: No Typology Code available for payment source | Admitting: Physical Medicine & Rehabilitation

## 2018-08-17 ENCOUNTER — Encounter: Payer: Self-pay | Admitting: Family Medicine

## 2018-08-17 ENCOUNTER — Ambulatory Visit: Payer: No Typology Code available for payment source | Admitting: Family Medicine

## 2018-08-17 ENCOUNTER — Other Ambulatory Visit: Payer: Self-pay

## 2018-08-17 VITALS — BP 138/80 | HR 65 | Temp 97.8°F | Ht 71.0 in | Wt 247.2 lb

## 2018-08-17 DIAGNOSIS — E119 Type 2 diabetes mellitus without complications: Secondary | ICD-10-CM | POA: Diagnosis not present

## 2018-08-17 DIAGNOSIS — I10 Essential (primary) hypertension: Secondary | ICD-10-CM | POA: Diagnosis not present

## 2018-08-17 DIAGNOSIS — I6522 Occlusion and stenosis of left carotid artery: Secondary | ICD-10-CM | POA: Diagnosis not present

## 2018-08-17 DIAGNOSIS — E782 Mixed hyperlipidemia: Secondary | ICD-10-CM

## 2018-08-17 DIAGNOSIS — N400 Enlarged prostate without lower urinary tract symptoms: Secondary | ICD-10-CM

## 2018-08-17 MED ORDER — METOPROLOL SUCCINATE ER 100 MG PO TB24: 100.0000 mg | ORAL_TABLET | Freq: Every day | ORAL | 1 refills | Status: AC

## 2018-08-17 MED ORDER — ATORVASTATIN CALCIUM 20 MG PO TABS: 20.0000 mg | ORAL_TABLET | Freq: Every day | ORAL | 3 refills | Status: DC

## 2018-08-17 MED ORDER — AMLODIPINE BESY-BENAZEPRIL HCL 10-20 MG PO CAPS: 1.0000 | ORAL_CAPSULE | Freq: Every day | ORAL | 1 refills | Status: AC

## 2018-08-17 MED ORDER — ASPIRIN 325 MG PO TABS: 325.0000 mg | ORAL_TABLET | Freq: Every day | ORAL | 0 refills | Status: DC

## 2018-08-17 MED ORDER — METFORMIN HCL ER 500 MG PO TB24: 500.0000 mg | ORAL_TABLET | Freq: Every day | ORAL | 1 refills | Status: DC

## 2018-08-17 NOTE — Progress Notes (Signed)
Established Patient Office Visit  Subjective:  Patient ID: Nathaniel Proctor, male    DOB: April 12, 1956  Age: 63 y.o. MRN: [id number]  CC:  Chief Complaint  Patient presents with  . Follow-up    HPI Nathaniel Proctor presents for a physical exam with fasting blood work for follow-up of his hypertension, diabetes and elevated cholesterol.  Patient is taking TriCor for years and is not absolutely certain how elevated his triglycerides have been in the past.  Appropriately Lipitor was added by his vascular surgeon because of the discovery of carotid artery stenosis.  Patient has been tolerating Lipitor well.  Blood pressure has been controlled with Lotrel and metoprolol.  Diabetes has been controlled as well.  He sees the eye doctor 4 times a year for follow-up of his glaucoma.  He has a dilated exam once yearly.  Due for colonoscopy next year.  Continues to be active aerobically and doing yoga.  He does have 2 to 3 glasses of wine nightly.  Urine flow is been good and he has no symptoms of BPH.  Past Medical History:  Diagnosis Date  . Allergy   . Depression   . Glaucoma   . Hx of seasonal allergies   . Hypercholesterolemia   . Hypertension     Past Surgical History:  Procedure Laterality Date  . COLONOSCOPY WITH PROPOFOL N/A 11/27/2014   Procedure: COLONOSCOPY WITH PROPOFOL;  Surgeon: Garlan Fair, MD;  Location: [facility];  Service: Endoscopy;  Laterality: N/A;  . Left wrist tendon repair Left 2000   [place]  . VASECTOMY      Family History  Problem Relation Age of Onset  . Cancer Mother   . Early death Mother 37  . COPD Father   . Stroke Father   . Drug abuse Brother   . Heart attack Maternal Grandmother   . Heart attack Maternal Grandfather     Social History   Socioeconomic History  . Marital status: Married    Spouse name: Bonnita Nasuti  . Number of children: 2  . Years of education: Not on file  . Highest education level: Bachelor's degree (e.g., BA, AB, BS)   Occupational History  . Occupation: retired  Scientific laboratory technician  . Financial resource strain: Not on file  . Food insecurity:    Worry: Not on file    Inability: Not on file  . Transportation needs:    Medical: Not on file    Non-medical: Not on file  Tobacco Use  . Smoking status: Former Smoker    Types: Cigarettes, Cigars    Last attempt to quit: 02/21/2011    Years since quitting: 7.4  . Smokeless tobacco: Never Used  Substance and Sexual Activity  . Alcohol use: Yes    Comment: wine  occ.  . Drug use: No  . Sexual activity: Not on file  Lifestyle  . Physical activity:    Days per week: Not on file    Minutes per session: Not on file  . Stress: Not on file  Relationships  . Social connections:    Talks on phone: Not on file    Gets together: Not on file    Attends religious service: Not on file    Active member of club or organization: Not on file    Attends meetings of clubs or organizations: Not on file    Relationship status: Not on file  . Intimate partner violence:    Fear of current or ex partner: Not  on file    Emotionally abused: Not on file    Physically abused: Not on file    Forced sexual activity: Not on file  Other Topics Concern  . Not on file  Social History Narrative   Patient is right-handed. He lives with his wife ina 2 story house. He drinks 4-5 cups of coffee a day, and an occasional glass of tea. He participates in Yoga on Tu and Th, and water aerobics on Mo, We and Fr, all for 60 minutes.    Outpatient Medications Prior to Visit  Medication Sig Dispense Refill  . Diclofenac Sodium (PENNSAID) 2 % SOLN Place 1 application onto the skin 2 (two) times daily. 1 Bottle 3  . latanoprost (XALATAN) 0.005 % ophthalmic solution Place 1 drop into both eyes at bedtime.    Marland Kitchen aspirin 325 MG tablet Take 325 mg by mouth daily.    . metFORMIN (GLUCOPHAGE-XR) 500 MG 24 hr tablet Take 1 tablet (500 mg total) by mouth at bedtime. 90 tablet 1  . amLODipine-benazepril  (LOTREL) 10-20 MG capsule Take 1 capsule by mouth daily. 90 capsule 1  . atorvastatin (LIPITOR) 10 MG tablet Take 1 tablet (10 mg total) by mouth daily at 6 PM. 90 tablet 1  . fenofibrate (TRICOR) 145 MG tablet Take 1 tablet (145 mg total) by mouth daily. 90 tablet 1  . metoprolol succinate (TOPROL-XL) 100 MG 24 hr tablet Take 1 tablet (100 mg total) by mouth daily. Take with or immediately following a meal. 90 tablet 1   No facility-administered medications prior to visit.     No Known Allergies  ROS Review of Systems  Constitutional: Negative for chills, diaphoresis, fatigue, fever and unexpected weight change.  HENT: Negative.   Eyes: Negative for photophobia and visual disturbance.  Respiratory: Negative.   Cardiovascular: Negative.   Gastrointestinal: Negative.   Endocrine: Negative for polyphagia and polyuria.  Genitourinary: Negative for difficulty urinating, frequency and urgency.  Musculoskeletal: Negative for gait problem, joint swelling and myalgias.  Skin: Negative for pallor and rash.  Allergic/Immunologic: Negative for immunocompromised state.  Neurological: Negative for headaches.  Hematological: Does not bruise/bleed easily.  Psychiatric/Behavioral: Negative.       Objective:    Physical Exam  Constitutional: He is oriented to person, place, and time. He appears well-developed and well-nourished. No distress.  HENT:  Head: Normocephalic and atraumatic.  Right Ear: External ear normal.  Left Ear: External ear normal.  Mouth/Throat: Oropharynx is clear and moist. No oropharyngeal exudate.  Eyes: Pupils are equal, round, and reactive to light. Conjunctivae are normal. Right eye exhibits no discharge. Left eye exhibits no discharge. No scleral icterus.  Neck: Neck supple. No JVD present. No tracheal deviation present. No thyromegaly present.  Cardiovascular: Normal rate, regular rhythm and normal heart sounds.  Pulmonary/Chest: Effort normal and breath sounds  normal. No stridor. No respiratory distress. He has no wheezes. He has no rales.  Abdominal: Soft. Bowel sounds are normal. He exhibits no distension. There is no abdominal tenderness. There is no rebound.  Genitourinary: Rectum:     Guaiac result negative.     No rectal mass, anal fissure, tenderness, external hemorrhoid, internal hemorrhoid or abnormal anal tone.  Prostate is enlarged. Prostate is not tender.  Musculoskeletal:        General: No edema.  Lymphadenopathy:    He has no cervical adenopathy.  Neurological: He is alert and oriented to person, place, and time.  Skin: Skin is warm and dry. He  is not diaphoretic.  Psychiatric: He has a normal mood and affect. His behavior is normal.    BP 138/80   Pulse 65   Temp 97.8 F (36.6 C) (Oral)   Ht 5\' 11"  (1.803 m)   Wt 247 lb 4 oz (112.2 kg)   SpO2 96%   BMI 34.48 kg/m  Wt Readings from Last 3 Encounters:  08/17/18 247 lb 4 oz (112.2 kg)  05/03/18 252 lb (114.3 kg)  02/16/18 244 lb (110.7 kg)   BP Readings from Last 3 Encounters:  08/17/18 138/80  05/03/18 (!) 169/95  02/16/18 (!) 178/80   Guideline developer:  UpToDate (see UpToDate for funding source) Date Released: June 2014  Health Maintenance Due  Topic Date Due  . FOOT EXAM  07/13/1964  . OPHTHALMOLOGY EXAM  07/13/1964  . HIV Screening  07/13/1969    There are no preventive care reminders to display for this patient.  No results found for: TSH Lab Results  Component Value Date   WBC 6.3 02/24/2018   HGB 14.9 02/24/2018   HCT 43.7 02/24/2018   MCV 93 02/24/2018   PLT 281 02/24/2018   Lab Results  Component Value Date   NA 142 02/24/2018   K 4.4 02/24/2018   CO2 25 02/24/2018   GLUCOSE 119 (H) 02/24/2018   BUN 10 02/24/2018   CREATININE 0.99 02/24/2018   BILITOT 0.5 02/24/2018   ALKPHOS 57 02/24/2018   AST 28 02/24/2018   ALT 31 02/24/2018   PROT 7.0 02/24/2018   ALBUMIN 4.4 02/24/2018   CALCIUM 9.1 02/24/2018   ANIONGAP 12 06/08/2017    Lab Results  Component Value Date   CHOL 144 02/24/2018   Lab Results  Component Value Date   HDL 41 02/24/2018   Lab Results  Component Value Date   LDLCALC 75 02/24/2018   Lab Results  Component Value Date   TRIG 138 02/24/2018   Lab Results  Component Value Date   CHOLHDL 3.5 02/24/2018   Lab Results  Component Value Date   HGBA1C 5.9 (H) 02/24/2018      Assessment & Plan:   Problem List Items Addressed This Visit      Cardiovascular and Mediastinum   Essential hypertension - Primary   Relevant Medications   amLODipine-benazepril (LOTREL) 10-20 MG capsule   aspirin 325 MG tablet   metoprolol succinate (TOPROL-XL) 100 MG 24 hr tablet   atorvastatin (LIPITOR) 20 MG tablet   Other Relevant Orders   CBC   Comprehensive metabolic panel   Microalbumin / creatinine urine ratio   Urinalysis, Routine w reflex microscopic   Stenosis of left carotid artery   Relevant Medications   amLODipine-benazepril (LOTREL) 10-20 MG capsule   aspirin 325 MG tablet   metoprolol succinate (TOPROL-XL) 100 MG 24 hr tablet   atorvastatin (LIPITOR) 20 MG tablet   Other Relevant Orders   Lipid panel     Endocrine   Controlled type 2 diabetes mellitus without complication, without long-term current use of insulin (HCC)   Relevant Medications   amLODipine-benazepril (LOTREL) 10-20 MG capsule   aspirin 325 MG tablet   metFORMIN (GLUCOPHAGE-XR) 500 MG 24 hr tablet   atorvastatin (LIPITOR) 20 MG tablet   Other Relevant Orders   CBC   Comprehensive metabolic panel   Hemoglobin A1c   Microalbumin / creatinine urine ratio   Urinalysis, Routine w reflex microscopic     Other   Elevated cholesterol with elevated triglycerides   Relevant Medications   amLODipine-benazepril (  LOTREL) 10-20 MG capsule   aspirin 325 MG tablet   metoprolol succinate (TOPROL-XL) 100 MG 24 hr tablet   atorvastatin (LIPITOR) 20 MG tablet   Other Relevant Orders   CBC   Comprehensive metabolic panel   LDL  cholesterol, direct   Lipid panel   Morbid obesity (HCC)   Relevant Medications   metFORMIN (GLUCOPHAGE-XR) 500 MG 24 hr tablet    Other Visit Diagnoses    Benign prostatic hyperplasia without lower urinary tract symptoms       Relevant Orders   PSA      Meds ordered this encounter  Medications  . amLODipine-benazepril (LOTREL) 10-20 MG capsule    Sig: Take 1 capsule by mouth daily.    Dispense:  90 capsule    Refill:  1  . aspirin 325 MG tablet    Sig: Take 1 tablet (325 mg total) by mouth daily.    Dispense:  365 tablet    Refill:  0  . metFORMIN (GLUCOPHAGE-XR) 500 MG 24 hr tablet    Sig: Take 1 tablet (500 mg total) by mouth at bedtime.    Dispense:  90 tablet    Refill:  1  . metoprolol succinate (TOPROL-XL) 100 MG 24 hr tablet    Sig: Take 1 tablet (100 mg total) by mouth daily. Take with or immediately following a meal.    Dispense:  90 tablet    Refill:  1  . atorvastatin (LIPITOR) 20 MG tablet    Sig: Take 1 tablet (20 mg total) by mouth daily.    Dispense:  90 tablet    Refill:  3    Follow-up: Return in about 6 months (around 02/17/2019).    Discussed the importance of lowering his LDL cholesterol regarding his known carotid artery stenosis.  We will hold the TriCor and increase the Lipitor to 20 mg daily.  Patient will let me know if he develops any unusual myalgias.  Recheck cholesterol at next visit.  Patient was given information on counting calories to lose weight and health maintenance.  Encouraged him to maintain his healthy lifestyle.  Discussed limiting his alcohol to no more than 2 drinks

## 2018-08-17 NOTE — Patient Instructions (Signed)
Health Maintenance, Male A healthy lifestyle and preventive care is important for your health and wellness. Ask your health care provider about what schedule of regular examinations is right for you. What should I know about weight and diet? Eat a Healthy Diet  Eat plenty of vegetables, fruits, whole grains, low-fat dairy products, and lean protein.  Do not eat a lot of foods high in solid fats, added sugars, or salt.  Maintain a Healthy Weight Regular exercise can help you achieve or maintain a healthy weight. You should:  Do at least 150 minutes of exercise each week. The exercise should increase your heart rate and make you sweat (moderate-intensity exercise).  Do strength-training exercises at least twice a week. Watch Your Levels of Cholesterol and Blood Lipids  Have your blood tested for lipids and cholesterol every 5 years starting at 64 years of age. If you are at high risk for heart disease, you should start having your blood tested when you are 64 years old. You may need to have your cholesterol levels checked more often if: ? Your lipid or cholesterol levels are high. ? You are older than 64 years of age. ? You are at high risk for heart disease. What should I know about cancer screening? Many types of cancers can be detected early and may often be prevented. Lung Cancer  You should be screened every year for lung cancer if: ? You are a current smoker who has smoked for at least 30 years. ? You are a former smoker who has quit within the past 15 years.  Talk to your health care provider about your screening options, when you should start screening, and how often you should be screened. Colorectal Cancer  Routine colorectal cancer screening usually begins at 64 years of age and should be repeated every 5-10 years until you are 64 years old. You may need to be screened more often if early forms of precancerous polyps or small growths are found. Your health care provider may  recommend screening at an earlier age if you have risk factors for colon cancer.  Your health care provider may recommend using home test kits to check for hidden blood in the stool.  A small camera at the end of a tube can be used to examine your colon (sigmoidoscopy or colonoscopy). This checks for the earliest forms of colorectal cancer. Prostate and Testicular Cancer  Depending on your age and overall health, your health care provider may do certain tests to screen for prostate and testicular cancer.  Talk to your health care provider about any symptoms or concerns you have about testicular or prostate cancer. Skin Cancer  Check your skin from head to toe regularly.  Tell your health care provider about any new moles or changes in moles, especially if: ? There is a change in a mole's size, shape, or color. ? You have a mole that is larger than a pencil eraser.  Always use sunscreen. Apply sunscreen liberally and repeat throughout the day.  Protect yourself by wearing long sleeves, pants, a wide-brimmed hat, and sunglasses when outside. What should I know about heart disease, diabetes, and high blood pressure?  If you are 18-39 years of age, have your blood pressure checked every 3-5 years. If you are 40 years of age or older, have your blood pressure checked every year. You should have your blood pressure measured twice-once when you are at a hospital or clinic, and once when you are not at a hospital   or clinic. Record the average of the two measurements. To check your blood pressure when you are not at a hospital or clinic, you can use: ? An automated blood pressure machine at a pharmacy. ? A home blood pressure monitor.  Talk to your health care provider about your target blood pressure.  If you are between 30-49 years old, ask your health care provider if you should take aspirin to prevent heart disease.  Have regular diabetes screenings by checking your fasting blood sugar  level. ? If you are at a normal weight and have a low risk for diabetes, have this test once every three years after the age of 48. ? If you are overweight and have a high risk for diabetes, consider being tested at a younger age or more often.  A one-time screening for abdominal aortic aneurysm (AAA) by ultrasound is recommended for men aged 80-75 years who are current or former smokers. What should I know about preventing infection? Hepatitis B If you have a higher risk for hepatitis B, you should be screened for this virus. Talk with your health care provider to find out if you are at risk for hepatitis B infection. Hepatitis C Blood testing is recommended for:  Everyone born from [birth year] through 1966.  Anyone with known risk factors for hepatitis C. Sexually Transmitted Diseases (STDs)  You should be screened each year for STDs including gonorrhea and chlamydia if: ? You are sexually active and are younger than 64 years of age. ? You are older than 64 years of age and your health care provider tells you that you are at risk for this type of infection. ? Your sexual activity has changed since you were last screened and you are at an increased risk for chlamydia or gonorrhea. Ask your health care provider if you are at risk.  Talk with your health care provider about whether you are at high risk of being infected with HIV. Your health care provider may recommend a prescription medicine to help prevent HIV infection. What else can I do?  Schedule regular health, dental, and eye exams.  Stay current with your vaccines (immunizations).  Do not use any tobacco products, such as cigarettes, chewing tobacco, and e-cigarettes. If you need help quitting, ask your health care provider.  Limit alcohol intake to no more than 2 drinks per day. One drink equals 12 ounces of beer, 5 ounces of wine, or 1 ounces of hard liquor.  Do not use street drugs.  Do not share needles.  Ask your health  care provider for help if you need support or information about quitting drugs.  Tell your health care provider if you often feel depressed.  Tell your health care provider if you have ever been abused or do not feel safe at home. This information is not intended to replace advice given to you by your health care provider. Make sure you discuss any questions you have with your health care provider. Document Released: 11/15/2007 Document Revised: 01/16/2016 Document Reviewed: 02/20/2015 Elsevier Interactive Patient Education  2019 [place] for [employer] Calories are units of energy. Your body needs a certain amount of calories from food to keep you going throughout the day. When you eat more calories than your body needs, your body stores the extra calories as fat. When you eat fewer calories than your body needs, your body burns fat to get the energy it needs. Calorie counting means keeping track of how many calories  you eat and drink each day. Calorie counting can be helpful if you need to lose weight. If you make sure to eat fewer calories than your body needs, you should lose weight. Ask your health care provider what a healthy weight is for you. For calorie counting to work, you will need to eat the right number of calories in a day in order to lose a healthy amount of weight per week. A dietitian can help you determine how many calories you need in a day and will give you suggestions on how to reach your calorie goal.  A healthy amount of weight to lose per week is usually 1-2 lb (0.5-0.9 kg). This usually means that your daily calorie intake should be reduced by 500-750 calories.  Eating 1,200 - 1,500 calories per day can help most women lose weight.  Eating 1,500 - 1,800 calories per day can help most men lose weight. What is my plan? My goal is to have __________ calories per day. If I have this many calories per day, I should lose around __________ pounds per  week. What do I need to know about calorie counting? In order to meet your daily calorie goal, you will need to:  Find out how many calories are in each food you would like to eat. Try to do this before you eat.  Decide how much of the food you plan to eat.  Write down what you ate and how many calories it had. Doing this is called keeping a food log. To successfully lose weight, it is important to balance calorie counting with a healthy lifestyle that includes regular activity. Aim for 150 minutes of moderate exercise (such as walking) or 75 minutes of vigorous exercise (such as running) each week. Where do I find calorie information?  The number of calories in a food can be found on a Nutrition Facts label. If a food does not have a Nutrition Facts label, try to look up the calories online or ask your dietitian for help. Remember that calories are listed per serving. If you choose to have more than one serving of a food, you will have to multiply the calories per serving by the amount of servings you plan to eat. For example, the label on a package of bread might say that a serving size is 1 slice and that there are 90 calories in a serving. If you eat 1 slice, you will have eaten 90 calories. If you eat 2 slices, you will have eaten 180 calories. How do I keep a food log? Immediately after each meal, record the following information in your food log:  What you ate. Don't forget to include toppings, sauces, and other extras on the food.  How much you ate. This can be measured in cups, ounces, or number of items.  How many calories each food and drink had.  The total number of calories in the meal. Keep your food log near you, such as in a small notebook in your pocket, or use a mobile app or website. Some programs will calculate calories for you and show you how many calories you have left for the day to meet your goal. What are some calorie counting tips?   Use your calories on foods  and drinks that will fill you up and not leave you hungry: ? Some examples of foods that fill you up are nuts and nut butters, vegetables, lean proteins, and high-fiber foods like whole grains. High-fiber foods are foods  with more than 5 g fiber per serving. ? Drinks such as sodas, specialty coffee drinks, alcohol, and juices have a lot of calories, yet do not fill you up.  Eat nutritious foods and avoid empty calories. Empty calories are calories you get from foods or beverages that do not have many vitamins or protein, such as candy, sweets, and soda. It is better to have a nutritious high-calorie food (such as an avocado) than a food with few nutrients (such as a bag of chips).  Know how many calories are in the foods you eat most often. This will help you calculate calorie counts faster.  Pay attention to calories in drinks. Low-calorie drinks include water and unsweetened drinks.  Pay attention to nutrition labels for "low fat" or "fat free" foods. These foods sometimes have the same amount of calories or more calories than the full fat versions. They also often have added sugar, starch, or salt, to make up for flavor that was removed with the fat.  Find a way of tracking calories that works for you. Get creative. Try different apps or programs if writing down calories does not work for you. What are some portion control tips?  Know how many calories are in a serving. This will help you know how many servings of a certain food you can have.  Use a measuring cup to measure serving sizes. You could also try weighing out portions on a kitchen scale. With time, you will be able to estimate serving sizes for some foods.  Take some time to put servings of different foods on your favorite plates, bowls, and cups so you know what a serving looks like.  Try not to eat straight from a bag or box. Doing this can lead to overeating. Put the amount you would like to eat in a cup or on a plate to make  sure you are eating the right portion.  Use smaller plates, glasses, and bowls to prevent overeating.  Try not to multitask (for example, watch TV or use your computer) while eating. If it is time to eat, sit down at a table and enjoy your food. This will help you to know when you are full. It will also help you to be aware of what you are eating and how much you are eating. What are tips for following this plan? Reading food labels  Check the calorie count compared to the serving size. The serving size may be smaller than what you are used to eating.  Check the source of the calories. Make sure the food you are eating is high in vitamins and protein and low in saturated and trans fats. Shopping  Read nutrition labels while you shop. This will help you make healthy decisions before you decide to purchase your food.  Make a grocery list and stick to it. Cooking  Try to cook your favorite foods in a healthier way. For example, try baking instead of frying.  Use low-fat dairy products. Meal planning  Use more fruits and vegetables. Half of your plate should be fruits and vegetables.  Include lean proteins like poultry and fish. How do I count calories when eating out?  Ask for smaller portion sizes.  Consider sharing an entree and sides instead of getting your own entree.  If you get your own entree, eat only half. Ask for a box at the beginning of your meal and put the rest of your entree in it so you are not tempted to  eat it.  If calories are listed on the menu, choose the lower calorie options.  Choose dishes that include vegetables, fruits, whole grains, low-fat dairy products, and lean protein.  Choose items that are boiled, broiled, grilled, or steamed. Stay away from items that are buttered, battered, fried, or served with cream sauce. Items labeled "crispy" are usually fried, unless stated otherwise.  Choose water, low-fat milk, unsweetened iced tea, or other drinks  without added sugar. If you want an alcoholic beverage, choose a lower calorie option such as a glass of wine or light beer.  Ask for dressings, sauces, and syrups on the side. These are usually high in calories, so you should limit the amount you eat.  If you want a salad, choose a garden salad and ask for grilled meats. Avoid extra toppings like bacon, cheese, or fried items. Ask for the dressing on the side, or ask for olive oil and vinegar or lemon to use as dressing.  Estimate how many servings of a food you are given. For example, a serving of cooked rice is  cup or about the size of half a baseball. Knowing serving sizes will help you be aware of how much food you are eating at restaurants. The list below tells you how big or small some common portion sizes are based on everyday objects: ? 1 oz-4 stacked dice. ? 3 oz-1 deck of cards. ? 1 tsp-1 die. ? 1 Tbsp- a ping-pong ball. ? 2 Tbsp-1 ping-pong ball. ?  cup- baseball. ? 1 cup-1 baseball. Summary  Calorie counting means keeping track of how many calories you eat and drink each day. If you eat fewer calories than your body needs, you should lose weight.  A healthy amount of weight to lose per week is usually 1-2 lb (0.5-0.9 kg). This usually means reducing your daily calorie intake by 500-750 calories.  The number of calories in a food can be found on a Nutrition Facts label. If a food does not have a Nutrition Facts label, try to look up the calories online or ask your dietitian for help.  Use your calories on foods and drinks that will fill you up, and not on foods and drinks that will leave you hungry.  Use smaller plates, glasses, and bowls to prevent overeating. This information is not intended to replace advice given to you by your health care provider. Make sure you discuss any questions you have with your health care provider. Document Released: 05/19/2005 Document Revised: 02/05/2018 Document Reviewed: 04/18/2016  Elsevier Interactive Patient Education  2019 [employer].

## 2018-08-18 LAB — CBC
HCT: 44.8 % (ref 38.5–50.0)
Hemoglobin: 15.5 g/dL (ref 13.2–17.1)
MCH: 32.4 pg (ref 27.0–33.0)
MCHC: 34.6 g/dL (ref 32.0–36.0)
MCV: 93.5 fL (ref 80.0–100.0)
MPV: 10.3 fL (ref 7.5–12.5)
Platelets: 290 10*3/uL (ref 140–400)
RBC: 4.79 10*6/uL (ref 4.20–5.80)
RDW: 11.8 % (ref 11.0–15.0)
WBC: 7.3 10*3/uL (ref 3.8–10.8)

## 2018-08-18 LAB — URINALYSIS, ROUTINE W REFLEX MICROSCOPIC
Bilirubin Urine: NEGATIVE
Glucose, UA: NEGATIVE
Hgb urine dipstick: NEGATIVE
Ketones, ur: NEGATIVE
LEUKOCYTE UA: NEGATIVE
NITRITE: NEGATIVE
Protein, ur: NEGATIVE
Specific Gravity, Urine: 1.008 (ref 1.001–1.03)
pH: 7 (ref 5.0–8.0)

## 2018-08-18 LAB — LIPID PANEL
Cholesterol: 131 mg/dL (ref <200)
HDL: 41 mg/dL (ref >=40)
LDL Cholesterol (Calc): 67 mg/dL (calc)
Non-HDL Cholesterol (Calc): 90 mg/dL (calc) (ref <130)
Total CHOL/HDL Ratio: 3.2 (calc) (ref <5.0)
Triglycerides: 155 mg/dL — ABNORMAL HIGH (ref <150)

## 2018-08-18 LAB — HEMOGLOBIN A1C
Hgb A1c MFr Bld: 6.2 % of total Hgb — ABNORMAL HIGH (ref <5.7)
MEAN PLASMA GLUCOSE: 131 (calc)
eAG (mmol/L): 7.3 (calc)

## 2018-08-18 LAB — COMPREHENSIVE METABOLIC PANEL
AG Ratio: 1.6 (calc) (ref 1.0–2.5)
ALT: 40 U/L (ref 9–46)
AST: 37 U/L — ABNORMAL HIGH (ref 10–35)
Albumin: 4.5 g/dL (ref 3.6–5.1)
Alkaline phosphatase (APISO): 52 U/L (ref 35–144)
BUN: 13 mg/dL (ref 7–25)
CO2: 25 mmol/L (ref 20–32)
Calcium: 9.6 mg/dL (ref 8.6–10.3)
Chloride: 102 mmol/L (ref 98–110)
Creat: 1.11 mg/dL (ref 0.70–1.25)
Globulin: 2.8 g/dL (calc) (ref 1.9–3.7)
Glucose, Bld: 101 mg/dL — ABNORMAL HIGH (ref 65–99)
Potassium: 4 mmol/L (ref 3.5–5.3)
Sodium: 139 mmol/L (ref 135–146)
Total Bilirubin: 0.8 mg/dL (ref 0.2–1.2)
Total Protein: 7.3 g/dL (ref 6.1–8.1)

## 2018-08-18 LAB — MICROALBUMIN / CREATININE URINE RATIO
Creatinine, Urine: 88 mg/dL (ref 20–320)
Microalb Creat Ratio: 45 mcg/mg creat — ABNORMAL HIGH (ref <30)
Microalb, Ur: 4 mg/dL

## 2018-08-18 LAB — PSA: PSA: 1.5 ng/mL (ref <=4.0)

## 2018-08-18 LAB — LDL CHOLESTEROL, DIRECT: Direct LDL: 72 mg/dL (ref <100)

## 2018-08-23 ENCOUNTER — Ambulatory Visit: Payer: No Typology Code available for payment source | Admitting: Family Medicine

## 2018-11-04 ENCOUNTER — Telehealth: Payer: Self-pay | Admitting: Physical Medicine & Rehabilitation

## 2018-11-04 NOTE — Telephone Encounter (Signed)
Received a fax request for prior auth for botox for [facility].  Spoke with MD - he is aware patient had issue with high billing charges but is aware of no other conversation since Jan.  Contacted [facility] and asked for reasoning for request  As we have not seen patient since January and no conversation since Jan 2020.  We have not requested Botox for this patient.  Coralie Keens from [employer] researching appeal

## 2019-02-21 ENCOUNTER — Ambulatory Visit: Payer: No Typology Code available for payment source | Admitting: Family Medicine

## 2019-06-28 ENCOUNTER — Ambulatory Visit: Payer: No Typology Code available for payment source

## 2019-07-07 ENCOUNTER — Ambulatory Visit: Payer: No Typology Code available for payment source

## 2019-07-08 ENCOUNTER — Ambulatory Visit: Payer: No Typology Code available for payment source

## 2019-07-15 ENCOUNTER — Ambulatory Visit: Payer: No Typology Code available for payment source

## 2019-07-15 ENCOUNTER — Ambulatory Visit: Payer: No Typology Code available for payment source | Attending: Internal Medicine

## 2019-07-15 DIAGNOSIS — Z23 Encounter for immunization: Secondary | ICD-10-CM | POA: Insufficient documentation

## 2019-07-15 NOTE — Progress Notes (Signed)
   Covid-19 Vaccination Clinic  Name:  Nathaniel Proctor    MRN: [id number] DOB: [birth year]  07/15/2019  [name] was observed post Covid-19 immunization for 15 minutes without incidence. He was provided with Vaccine Information Sheet and instruction to access the V-Safe system.   [name] was instructed to call 911 with any severe reactions post vaccine: Marland Kitchen Difficulty breathing  . Swelling of your face and throat  . A fast heartbeat  . A bad rash all over your body  . Dizziness and weakness    Immunizations Administered    Name Date Dose VIS Date Route   Pfizer COVID-19 Vaccine 07/15/2019  8:50 AM 0.3 mL 05/13/2019 Intramuscular   Manufacturer: [place]   Lot: [id number]   [place]: [id number]

## 2019-08-07 ENCOUNTER — Ambulatory Visit: Payer: No Typology Code available for payment source | Attending: Internal Medicine

## 2019-08-07 DIAGNOSIS — Z23 Encounter for immunization: Secondary | ICD-10-CM | POA: Insufficient documentation

## 2019-08-07 NOTE — Progress Notes (Signed)
   Covid-19 Vaccination Clinic  Name:  Nathaniel Proctor    MRN: [id number] DOB: Oct 23, 1955  08/07/2019  [name] was observed post Covid-19 immunization for 30 minutes based on pre-vaccination screening without incident. He was provided with Vaccine Information Sheet and instruction to access the V-Safe system.   [name] was instructed to call 911 with any severe reactions post vaccine: Marland Kitchen Difficulty breathing  . Swelling of face and throat  . A fast heartbeat  . A bad rash all over body  . Dizziness and weakness   Immunizations Administered    Name Date Dose VIS Date Route   Pfizer COVID-19 Vaccine 08/07/2019  8:34 AM 0.3 mL 05/13/2019 Intramuscular   Manufacturer: [place]   Lot: [id number]   [place]: [id number]

## 2019-08-09 ENCOUNTER — Telehealth: Payer: Self-pay | Admitting: Gastroenterology

## 2019-08-09 NOTE — Telephone Encounter (Signed)
ROI fax to [facility] for patient records 08/09/19

## 2019-09-27 ENCOUNTER — Telehealth: Payer: Self-pay | Admitting: Gastroenterology

## 2019-09-27 NOTE — Telephone Encounter (Signed)
Dr. Lyndel Safe  We received a referral for this pt for colonoscopy.  He has had a colonoscopy in 2016 @ [facility].  He would like to transfer care to you.  I am sending you the records for your review.  Will you accept this pt?

## 2019-09-27 NOTE — Telephone Encounter (Signed)
More than happy to accept him as a patient RG

## 2019-11-07 ENCOUNTER — Other Ambulatory Visit: Payer: Self-pay

## 2019-11-07 ENCOUNTER — Ambulatory Visit (AMBULATORY_SURGERY_CENTER): Payer: Self-pay | Admitting: *Deleted

## 2019-11-07 VITALS — Ht 71.0 in | Wt 249.0 lb

## 2019-11-07 DIAGNOSIS — Z86010 Personal history of colonic polyps: Secondary | ICD-10-CM

## 2019-11-07 NOTE — Progress Notes (Signed)

## 2019-11-17 ENCOUNTER — Encounter: Payer: Self-pay | Admitting: Gastroenterology

## 2019-11-25 ENCOUNTER — Ambulatory Visit (AMBULATORY_SURGERY_CENTER): Payer: Medicare Other | Admitting: Gastroenterology

## 2019-11-25 ENCOUNTER — Encounter: Payer: Self-pay | Admitting: Gastroenterology

## 2019-11-25 ENCOUNTER — Other Ambulatory Visit: Payer: Self-pay

## 2019-11-25 VITALS — BP 139/79 | HR 51 | Temp 97.1°F | Resp 15 | Ht 71.0 in | Wt 247.0 lb

## 2019-11-25 DIAGNOSIS — K635 Polyp of colon: Secondary | ICD-10-CM

## 2019-11-25 DIAGNOSIS — Z86010 Personal history of colonic polyps: Secondary | ICD-10-CM | POA: Diagnosis not present

## 2019-11-25 DIAGNOSIS — D125 Benign neoplasm of sigmoid colon: Secondary | ICD-10-CM

## 2019-11-25 MED ORDER — SODIUM CHLORIDE 0.9 % IV SOLN: 500.0000 mL | Freq: Once | INTRAVENOUS | Status: DC

## 2019-11-25 NOTE — Progress Notes (Signed)
Pt's states no medical or surgical changes since previsit or office visit. 

## 2019-11-25 NOTE — Patient Instructions (Signed)
YOU HAD AN ENDOSCOPIC PROCEDURE TODAY AT THE [facility]:   Refer to the procedure report that was given to you for any specific questions about what was found during the examination.  If the procedure report does not answer your questions, please call your gastroenterologist to clarify.  If you requested that your care partner not be given the details of your procedure findings, then the procedure report has been included in a sealed envelope for you to review at your convenience later.  YOU SHOULD EXPECT: Some feelings of bloating in the abdomen. Passage of more gas than usual.  Walking can help get rid of the air that was put into your GI tract during the procedure and reduce the bloating. If you had a lower endoscopy (such as a colonoscopy or flexible sigmoidoscopy) you may notice spotting of blood in your stool or on the toilet paper. If you underwent a bowel prep for your procedure, you may not have a normal bowel movement for a few days.  Please Note:  You might notice some irritation and congestion in your nose or some drainage.  This is from the oxygen used during your procedure.  There is no need for concern and it should clear up in a day or so.  SYMPTOMS TO REPORT IMMEDIATELY:   Following lower endoscopy (colonoscopy or flexible sigmoidoscopy):  Excessive amounts of blood in the stool  Significant tenderness or worsening of abdominal pains  Swelling of the abdomen that is new, acute  Fever of 100F or higher  For urgent or emergent issues, a gastroenterologist can be reached at any hour by calling [phone]. Do not use MyChart messaging for urgent concerns.    DIET:  We do recommend a small meal at first, but then you may proceed to your regular diet.  Drink plenty of fluids but you should avoid alcoholic beverages for 24 hours.  ACTIVITY:  You should plan to take it easy for the rest of today and you should NOT DRIVE or use heavy machinery until tomorrow (because  of the sedation medicines used during the test).    FOLLOW UP: Our staff will call the number listed on your records 48-72 hours following your procedure to check on you and address any questions or concerns that you may have regarding the information given to you following your procedure. If we do not reach you, we will leave a message.  We will attempt to reach you two times.  During this call, we will ask if you have developed any symptoms of COVID 19. If you develop any symptoms (ie: fever, flu-like symptoms, shortness of breath, cough etc.) before then, please call [phone].  If you test positive for Covid 19 in the 2 weeks post procedure, please call and report this information to us.    If any biopsies were taken you will be contacted by phone or by letter within the next 1-3 weeks.  Please call us at [phone] if you have not heard about the biopsies in 3 weeks.    SIGNATURES/CONFIDENTIALITY: You and/or your care partner have signed paperwork which will be entered into your electronic medical record.  These signatures attest to the fact that that the information above on your After Visit Summary has been reviewed and is understood.  Full responsibility of the confidentiality of this discharge information lies with you and/or your care-partner. 

## 2019-11-25 NOTE — Op Note (Signed)
[place] Patient Name: Nathaniel Proctor Procedure Date: 11/25/2019 10:27 AM MRN: [id number] Endoscopist: Jackquline Denmark , MD Age: 64 Referring MD:  Date of Birth: 29-Jul-1955 Gender: Male Account #: [id number] Procedure:                Colonoscopy Indications:              High risk colon cancer surveillance: Personal                            history of colonic polyps Medicines:                Monitored Anesthesia Care Procedure:                Pre-Anesthesia Assessment:                           - Prior to the procedure, a History and Physical                            was performed, and patient medications and                            allergies were reviewed. The patient's tolerance of                            previous anesthesia was also reviewed. The risks                            and benefits of the procedure and the sedation                            options and risks were discussed with the patient.                            All questions were answered, and informed consent                            was obtained. Prior Anticoagulants: The patient has                            taken no previous anticoagulant or antiplatelet                            agents. ASA Grade Assessment: II - A patient with                            mild systemic disease. After reviewing the risks                            and benefits, the patient was deemed in                            satisfactory condition to undergo the procedure.  After obtaining informed consent, the colonoscope                            was passed under direct vision. Throughout the                            procedure, the patient's blood pressure, pulse, and                            oxygen saturations were monitored continuously. The                            Colonoscope was introduced through the anus and                            advanced to the the cecum, identified by                             appendiceal orifice and ileocecal valve. The                            colonoscopy was performed without difficulty. The                            patient tolerated the procedure well. The quality                            of the bowel preparation was adequate to identify                            polyps. Some retained stool and solid vegetable                            material especially in the right side of the colon.                            Aggressive suctioning and aspiration was performed.                            Overall over 90-95% of colonic mucosa visualized                            satisfactorily. Of note the small and flat lesions                            could have been missed. The ileocecal valve,                            appendiceal orifice, and rectum were photographed. Scope In: 10:36:22 AM Scope Out: 10:56:49 AM Scope Withdrawal Time: 0 hours 9 minutes 13 seconds  Total Procedure Duration: 0 hours 20 minutes 27 seconds  Findings:                 A 2 mm polyp was  found in the distal sigmoid colon.                            The polyp was sessile. The polyp was removed with a                            cold biopsy forceps. Resection and retrieval were                            complete.                           Multiple small and large-mouthed diverticula were                            found in the sigmoid colon, descending colon,                            hepatic flexure, ascending colon and cecum.                           Non-bleeding internal hemorrhoids were found during                            retroflexion. The hemorrhoids were small.                           The exam was otherwise without abnormality on                            direct and retroflexion views. Complications:            No immediate complications. Estimated Blood Loss:     Estimated blood loss: none. Impression:               - One 2 mm polyp in the  distal sigmoid colon,                            removed with a cold biopsy forceps. Resected and                            retrieved.                           - Pancolonic diverticulosis predominantly in the                            sigmoid colon.                           - Non-bleeding internal hemorrhoids.                           - The examination was otherwise normal on direct                            and  retroflexion views. Recommendation:           - Patient has a contact number available for                            emergencies. The signs and symptoms of potential                            delayed complications were discussed with the                            patient. Return to normal activities tomorrow.                            Written discharge instructions were provided to the                            patient.                           - High fiber diet.                           - Resume previous diet.                           - Await pathology results.                           - Repeat colonoscopy for surveillance based on                            pathology results.                           - Return to GI clinic PRN.                           - The findings and recommendations were discussed                            with the patient's family. Jackquline Denmark, MD 11/25/2019 11:04:03 AM This report has been signed electronically.

## 2019-11-25 NOTE — Progress Notes (Signed)
Called to room to assist during endoscopic procedure.  Patient ID and intended procedure confirmed with present staff. Received instructions for my participation in the procedure from the performing physician.  

## 2019-11-25 NOTE — Progress Notes (Signed)
VS-VV 

## 2019-11-25 NOTE — Progress Notes (Signed)
Report to PACU, RN, vss, BBS= Clear.  

## 2019-11-29 ENCOUNTER — Telehealth: Payer: Self-pay

## 2019-11-29 NOTE — Telephone Encounter (Signed)
  Follow up Call-  Call back number 11/25/2019  Post procedure Call Back phone  # [phone]  Permission to leave phone message Yes  Some recent data might be hidden     Patient questions:  Do you have a fever, pain , or abdominal swelling? No. Pain Score  0 *  Have you tolerated food without any problems? Yes.    Have you been able to return to your normal activities? Yes.    Do you have any questions about your discharge instructions: Diet   No. Medications  No. Follow up visit  No.  Do you have questions or concerns about your Care? No.  Actions: * If pain score is 4 or above: 1. No action needed, pain <4.Have you developed a fever since your procedure? no  2.   Have you had an respiratory symptoms (SOB or cough) since your procedure? no  3.   Have you tested positive for COVID 19 since your procedure no  4.   Have you had any family members/close contacts diagnosed with the COVID 19 since your procedure?  no   If yes to any of these questions please route to Joylene John, RN and Erenest Rasher, RN

## 2019-11-29 NOTE — Telephone Encounter (Signed)
First attempt follow up call to pt, lm on vm 

## 2019-12-13 ENCOUNTER — Encounter: Payer: Self-pay | Admitting: Gastroenterology

## 2020-07-13 ENCOUNTER — Encounter: Payer: Self-pay | Admitting: Occupational Therapy

## 2020-07-13 ENCOUNTER — Other Ambulatory Visit: Payer: Self-pay

## 2020-07-13 ENCOUNTER — Ambulatory Visit: Payer: Medicare Other | Attending: Unknown Physician Specialty | Admitting: Occupational Therapy

## 2020-07-13 DIAGNOSIS — M25622 Stiffness of left elbow, not elsewhere classified: Secondary | ICD-10-CM | POA: Diagnosis not present

## 2020-07-13 DIAGNOSIS — R278 Other lack of coordination: Secondary | ICD-10-CM | POA: Insufficient documentation

## 2020-07-13 DIAGNOSIS — M6281 Muscle weakness (generalized): Secondary | ICD-10-CM | POA: Insufficient documentation

## 2020-07-13 DIAGNOSIS — Z9181 History of falling: Secondary | ICD-10-CM | POA: Insufficient documentation

## 2020-07-13 DIAGNOSIS — M25642 Stiffness of left hand, not elsewhere classified: Secondary | ICD-10-CM | POA: Diagnosis present

## 2020-07-14 NOTE — Therapy (Signed)
[facility] [place], [place], [address] Phone: [phone]   Fax:  [phone]  Occupational Therapy Evaluation  Patient Details  Name: Nathaniel Proctor MRN: [id number] Date of Birth: Nov 17, 1955 Referring Provider (OT): Laurena Slimmer, MD   Encounter Date: 07/13/2020   OT End of Session - 07/13/20 0927    Visit Number 1    Number of Visits 9    Date for OT Re-Evaluation 09/07/20    Authorization Type [employer]    OT Start Time 0930    OT Stop Time 1016    OT Time Calculation (min) 46 min    Activity Tolerance Patient tolerated treatment well    Behavior During Therapy [facility] for tasks assessed/performed           Past Medical History:  Diagnosis Date  . Allergy   . Depression   . Dystonia    left side  . Glaucoma   . Hx of seasonal allergies   . Hypercholesterolemia   . Hypertension     Past Surgical History:  Procedure Laterality Date  . COLONOSCOPY    . COLONOSCOPY WITH PROPOFOL N/A 11/27/2014   Procedure: COLONOSCOPY WITH PROPOFOL;  Surgeon: Garlan Fair, MD;  Location: [facility] END[facility];  Service: Endoscopy;  Laterality: N/A;  . Left wrist tendon repair Left 2000   [place]  . POLYPECTOMY    . VASECTOMY      There were no vitals filed for this visit.   Subjective Assessment - 07/13/20 0923    Subjective  Pt reports he recently finished treatment for lung cancer last week and that he has been fatigued. He also discussed current blockage in the L carotid artery that his physician is following closely to determine if further intervention is necessary. Pt reports LUE pain and cramping started approximately 3 years ago and has been progressing since onset; he received PT all last year at [facility] for his LUE. Pt verbalized that "there is only so much you can do with the arm if you can't cure it."    Pertinent History Arthritis, neuropathy, gait/balance deficits,  Type 2 DM, glaucoma, lupus, and HLD and HTN    Currently in Pain? No/denies             [facility] OT Assessment - 07/13/20 0928      Assessment   Medical Diagnosis Focal dystonia of L hand    Referring Provider (OT) Laurena Slimmer, MD    Hand Dominance Right    Prior Therapy OP PT      Balance Screen   Has the patient fallen in the past 6 months Yes    Has the patient had a decrease in activity level because of a fear of falling?  Yes    Is the patient reluctant to leave their home because of a fear of falling?  No      Home  Environment   Family/patient expects to be discharged to: Private residence    Type of [place] Two level   Bed/bath upstairs   Bathroom Shower/Tub [place] - single point;Shower Hydrographic surveyor with bath   Lives With Spouse      Prior Function   Level of Independence Requires assistive device for independence    Vocation Retired    Leisure  Travel, golf   Pandemic has affected leisure activities     ADL   Eating/Feeding Needs assist with cutting food    Grooming Modified independent    ADL comments Pt reports he is independent with pullover shirts and pull-on pants, but requires Mod-Max A assist from his wife to don and doff non-stretch pants or clothing with fasteners. Pt also reports he has been able to independently find compensatory strategies during ADLs, but is open to trialing assistive devices, prn      IADL   Shopping Takes care of all shopping needs independently    [place] alone or with occasional assistance   Doesn't carry items up/down staris due to risk of falls/decreased balance   Meal Prep Plans, prepares and serves adequate meals independently    Programmer, applications own vehicle   Short distances   Medication Management Is responsible for taking medication in correct dosages at correct time    Difficulty opening medication bottles     Mobility   Mobility Status History of falls   Uses single-point cane for functional mobility     Vision - History   Baseline Vision Wears glasses all the time   has glasses for both distance and reading   Visual History Glaucoma      Vision Assessment   Eye Alignment Impaired (comment)   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   Vision Assessment Vision not tested   Due to time constraints; Will be assessed in functional context     Observation/Other Assessments   Observations LUE positioned in elbow flexion, slight wrist flexion, and MCP flexion at rest   Pt unable to fully extend elbow     Sensation   Light Touch Not tested   No deficits with sensation, per pt report     Coordination   Coordination and Movement Description Movements appear fluid and coordinated in RUE; Deficits with GM/FM coordination of LUE due to dystonia in elbow, wrist, and hand      Tone   Assessment Location Left Upper Extremity      Palpation   Palpation comment No tenderness to palpation      AROM   Right Shoulder Flexion 143 Degrees    Right Shoulder ABduction 120 Degrees    Left Shoulder Flexion 112 Degrees    Left Shoulder ABduction 83 Degrees    Right Elbow Flexion 123    Left Elbow Flexion 90    Left Elbow Extension 75   Pt able to reach 75 degrees of flexion when attempting L elbow extension   Left Wrist Flexion 15 Degrees   No active flexion/ext; pt reports hx of injuries to wrist that have decreased ROM significantly   Left Wrist Radial Deviation --    Left Composite Finger Extension --   64 degrees of flexion achieved when attempting composite finger extension   Left Composite Finger Flexion --   87     PROM   Overall PROM Comments OT able to achieve slightly greater elbow extension with gentle PROM compared with AROM   Will be assessed further in functional context     Hand Function   Comment Unable to assess during eval; Pt reports  decreased strength and coordination of grasp with L hand      LUE Tone   LUE Tone Hypertonic   Unable to fully assess due to time; will continue to assess in functional context  OT Education - 07/13/20 [phone]    Education Details Education provided on role and purpose of OT, as well as potential interventions and goals for therapy.    Person(s) Educated Patient    Methods Explanation    Comprehension Verbalized understanding            OT Short Term Goals - 07/14/20 1245      OT SHORT TERM GOAL #1   Title Pt will verbalize understanding of at least 2 safety/fall prevention strategies to improve balance and safety with in-home functional mobility    Baseline Recurrent falls; decreased knowledge of fall prevention/safety strategies    Time 4    Period Weeks    Status New    Target Date 08/10/20      OT SHORT TERM GOAL #2   Title Pt will be able to complete initial HEP designed for LUE ROM independently    Baseline No current HEP    Time 4    Status New      OT SHORT TERM GOAL #3   Title Pt will be Mod I with opening medication bottles in at least 2/4 trials    Baseline Requires assist from wife to open most medication bottles    Time 4    Period Weeks    Status New             OT Long Term Goals - 07/14/20 1250      OT LONG TERM GOAL #1   Title Pt will demonstrate understanding of compensatory strategies, including use of assistive devices, 100% of the time to improve participation in ADLs    Baseline No consistent compensatory strategies/AE for more challenging ADL tasks    Time 8    Period Weeks    Status New    Target Date 09/07/20      OT LONG TERM GOAL #2   Title Pt will be Mod I with UB/LB dressing of non-stretch clothes/clothes with fasteners using adaptive techniques/assistive devices as needed    Baseline Currently requires assistance from wife to don/doff non-stretch clothing or items with fasteners    Time 8    Period Weeks    Status New       OT LONG TERM GOAL #3   Title Pt will be Mod I with cutting food at least 50% of the time    Baseline Unable to cut tougher foods at this time    Time 8    Period Weeks    Status New      OT LONG TERM GOAL #4   Title Per self-report, pt will be independent with home carryover of HEP designed to improve functional use of LUE    Baseline No current HEP    Time 8    Period Weeks    Status New             Plan - 07/13/20 9211    Clinical Impression Statement Pt is a 65 y.o. male who presents to OP OT secondary to focal dystonia of the LUE and increased balance deficits that began to impact him functionally approx. 3 years ago and have progressed since then. Pt currently lives with his wife in a 2-story home and is retired. PMHx includes arthritis, balance problems, blood dyscrasia, carotid stenosis, controlled Type 2 DM w/out complication/long-term current use of insulin, glaucoma, HLD, HTN, Lupus, and non-small cell cancer of L lung (04/16/2020). Pt will benefit from skilled occupational therapy services to address decreased strength  and coordination, implementation of AE/assistive devices, and development of an HEP program to improve participation and safety during ADLs and IADLs.    OT Occupational Profile and History Detailed Assessment- Review of Records and additional review of physical, cognitive, psychosocial history related to current functional performance    Occupational performance deficits (Please refer to evaluation for details): ADL's;IADL's;Leisure    Body Structure / Function / Physical Skills ADL;ROM;UE functional use;Balance;Decreased knowledge of use of DME;FMC;GMC;Body mechanics;Gait;Pain;Strength;Coordination;IADL;Tone    Rehab Potential Good    Clinical Decision Making Several treatment options, min-mod task modification necessary    Comorbidities Affecting Occupational Performance: Presence of comorbidities impacting occupational performance    Modification or  Assistance to Complete Evaluation  Min-Moderate modification of tasks or assist with assess necessary to complete eval    OT Frequency 1x / week    OT Duration 8 weeks    OT Treatment/Interventions Self-care/ADL training;Electrical Stimulation;Therapeutic exercise;Moist Heat;Paraffin;Neuromuscular education;Patient/family education;Splinting;Compression bandaging;Energy conservation;Fluidtherapy;Therapeutic activities;Balance training;Cryotherapy;DME and/or AE instruction;Ultrasound;Manual Therapy;Passive range of motion    Plan Review goals; assess tone and PROM; initiate stretching for LUE    Consulted and Agree with Plan of Care Patient           Patient will benefit from skilled therapeutic intervention in order to improve the following deficits and impairments:   Body Structure / Function / Physical Skills: ADL,ROM,UE functional use,Balance,Decreased knowledge of use of DME,FMC,GMC,Body mechanics,Gait,Pain,Strength,Coordination,IADL,Tone       Visit Diagnosis: Stiffness of left elbow, not elsewhere classified  Stiffness of left hand, not elsewhere classified  Other lack of coordination  Muscle weakness (generalized)  History of falling    Problem List Patient Active Problem List   Diagnosis Date Noted  . Acute pain of left shoulder 10/13/2017  . Essential hypertension 10/12/2017  . Elevated cholesterol with elevated triglycerides 10/12/2017  . Controlled type 2 diabetes mellitus without complication, without long-term current use of insulin ([place]) 10/12/2017  . Stenosis of left carotid artery 10/12/2017  . Left arm pain 10/12/2017  . Glaucoma 10/12/2017  . 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 Alcohol use 10/12/2017  . Morbid obesity ([place]) 10/12/2017     Kathrine Cords, OTR/L, [place] 07/14/2020, 1:04 PM  [address] [place], [place], [address] Phone: [phone]   Fax:  [phone]  Name: Nathaniel Proctor MRN:  [id number] Date of Birth: 08-10-1955

## 2020-07-17 NOTE — Addendum Note (Signed)
Addended by: Kathrine Cords on: 07/17/2020 05:57 PM   Modules accepted: Orders

## 2020-07-19 NOTE — Addendum Note (Signed)
Addended by: Kathrine Cords on: 07/19/2020 11:23 AM   Modules accepted: Orders

## 2020-07-20 ENCOUNTER — Ambulatory Visit: Payer: Medicare Other | Admitting: Occupational Therapy

## 2020-07-20 ENCOUNTER — Other Ambulatory Visit: Payer: Self-pay

## 2020-07-20 ENCOUNTER — Encounter: Payer: Self-pay | Admitting: Occupational Therapy

## 2020-07-20 DIAGNOSIS — R278 Other lack of coordination: Secondary | ICD-10-CM

## 2020-07-20 DIAGNOSIS — Z9181 History of falling: Secondary | ICD-10-CM

## 2020-07-20 DIAGNOSIS — M6281 Muscle weakness (generalized): Secondary | ICD-10-CM

## 2020-07-20 DIAGNOSIS — M25642 Stiffness of left hand, not elsewhere classified: Secondary | ICD-10-CM

## 2020-07-20 DIAGNOSIS — M25622 Stiffness of left elbow, not elsewhere classified: Secondary | ICD-10-CM

## 2020-07-20 NOTE — Patient Instructions (Signed)
Access Code: [email or web address] URL: [email or web address] Date: 07/20/2020 Prepared by:  Exercises Supine Shoulder Flexion AAROM with Hands Clasped - 2-3 x daily - 1 sets - 15-20 reps Hand PROM Finger Extension - 2-3 x daily - 1 sets - 10 reps - 10-15 hold

## 2020-07-20 NOTE — Therapy (Signed)
[facility] [place]. [place], [place], [address] Phone: [phone]   Fax:  [phone]  Occupational Therapy Treatment  Patient Details  Name: Nathaniel Proctor MRN: [id number] Date of Birth: 1955-09-12 Referring Provider (OT): Laurena Slimmer, MD   Encounter Date: 07/20/2020   OT End of Session - 07/20/20 1159    Visit Number 2    Number of Visits 9    Date for OT Re-Evaluation 09/07/20    Authorization Type [employer]    OT Start Time [phone]    OT Stop Time 1012    OT Time Calculation (min) 43 min    Activity Tolerance Patient tolerated treatment well    Behavior During Therapy [facility] for tasks assessed/performed           Past Medical History:  Diagnosis Date  . Allergy   . Depression   . Dystonia    left side  . Glaucoma   . Hx of seasonal allergies   . Hypercholesterolemia   . Hypertension     Past Surgical History:  Procedure Laterality Date  . COLONOSCOPY    . COLONOSCOPY WITH PROPOFOL N/A 11/27/2014   Procedure: COLONOSCOPY WITH PROPOFOL;  Surgeon: Garlan Fair, MD;  Location: [facility];  Service: Endoscopy;  Laterality: N/A;  . Left wrist tendon repair Left 2000   [place]  . POLYPECTOMY    . VASECTOMY      There were no vitals filed for this visit.   Subjective Assessment - 07/20/20 1155    Subjective  Pt reports things have been going well and that at during his previous therapy sessions at another clinic, they focused more on stretching the elbow out so that he would be able to put his hand in his pocket.    Pertinent History Arthritis, neuropathy, gait/balance deficits, Type 2 DM, glaucoma, lupus, and HLD and HTN    Currently in Pain? No/denies            OT Treatments/Exercises (OP) - 07/20/20 1011      Hand Exercises   MCPJ Extension Self ROM;Left;10 reps   OT demo'd self-PROM of finger extension; pt returned demo and completed exercise 10x     Neurological  Re-education Exercises   Other Grasp and Release Exercises  Pt attempted grasp/release activity and was unable to release cone/achieve finger extension; OT graded activity down to making a fist and then releasing (5x)      Manual Therapy   Passive ROM Passive stretching of elbow flexion and extension, as well as finger extension completed to decrease stiffness and improve ROM   Mild hypertonicity of L fingers with elbow flexion that resolved as stretch was held at end range           OT Education - 07/20/20 1207    Education Details OT reviewed all goals with pt and introduced personalized HEP this session. Education provided on PROM and stretching for the LUE as well as other options to improve functional use of the UE.    Person(s) Educated Patient    Methods Explanation;Demonstration;Handout    Comprehension Verbalized understanding;Returned demonstration            OT Short Term Goals - 07/20/20 1206      OT SHORT TERM GOAL #1   Title Pt will verbalize understanding of at least 2 safety/fall prevention strategies to improve balance and safety with in-home functional mobility    Baseline Recurrent falls; decreased knowledge of  fall prevention/safety strategies    Time 4    Period Weeks    Status On-going    Target Date 08/10/20      OT SHORT TERM GOAL #2   Title Pt will be able to complete initial HEP designed for LUE ROM independently    Baseline No current HEP    Time 4    Status On-going      OT SHORT TERM GOAL #3   Title Pt will be Mod I with opening medication bottles in at least 2/4 trials    Baseline Requires assist from wife to open most medication bottles    Time 4    Period Weeks    Status Deferred   Pt expressed lack of interest in wanting to work toward this goal.            OT Long Term Goals - 07/20/20 1207      OT LONG TERM GOAL #1   Title Pt will demonstrate understanding of compensatory strategies, including use of assistive devices, 100% of the  time to improve participation in ADLs    Baseline No consistent compensatory strategies/AE for more challenging ADL tasks    Time 8    Period Weeks    Status On-going      OT LONG TERM GOAL #2   Title Pt will be Mod I with UB/LB dressing of non-stretch clothes/clothes with fasteners using adaptive techniques/assistive devices as needed    Baseline Currently requires assistance from wife to don/doff non-stretch clothing or items with fasteners    Time 8    Period Weeks    Status On-going      OT LONG TERM GOAL #3   Title Pt will be Mod I with cutting food at least 50% of the time    Baseline Unable to cut tougher foods at this time    Time 8    Period Weeks    Status On-going      OT LONG TERM GOAL #4   Title Per self-report, pt will be independent with home carryover of HEP designed to improve functional use of LUE    Baseline No current HEP    Time 8    Period Weeks    Status On-going            Plan - 07/20/20 1202    Clinical Impression Statement Pt has significant stiffness of the L elbow, wrist, and hand/fingers. Pt is currently unable to fully extend elbow, but OT was able to increased elbow flexion and extension with gentle stretching through PROM. Pt is unable to complete wrist flexion/extension and PROM is extremely limited. Pt also demo'd decreased finger extension; OT was able to increase PROM with gentle stretching.    OT Occupational Profile and History Detailed Assessment- Review of Records and additional review of physical, cognitive, psychosocial history related to current functional performance    Occupational performance deficits (Please refer to evaluation for details): ADL's;IADL's;Leisure    Body Structure / Function / Physical Skills ADL;ROM;UE functional use;Balance;Decreased knowledge of use of DME;FMC;GMC;Body mechanics;Gait;Pain;Strength;Coordination;IADL;Tone    Rehab Potential Good    Clinical Decision Making Several treatment options, min-mod task  modification necessary    Comorbidities Affecting Occupational Performance: Presence of comorbidities impacting occupational performance    Modification or Assistance to Complete Evaluation  Min-Moderate modification of tasks or assist with assess necessary to complete eval    OT Frequency 1x / week    OT Duration 8 weeks  OT Treatment/Interventions Self-care/ADL training;Electrical Stimulation;Therapeutic exercise;Moist Heat;Paraffin;Neuromuscular education;Patient/family education;Splinting;Compression bandaging;Energy conservation;Fluidtherapy;Therapeutic activities;Balance training;Cryotherapy;DME and/or AE instruction;Ultrasound;Manual Therapy;Passive range of motion    Plan Review goals; assess tone and PROM; initiate stretching for LUE    Consulted and Agree with Plan of Care Patient           Patient will benefit from skilled therapeutic intervention in order to improve the following deficits and impairments:   Body Structure / Function / Physical Skills: ADL,ROM,UE functional use,Balance,Decreased knowledge of use of DME,FMC,GMC,Body mechanics,Gait,Pain,Strength,Coordination,IADL,Tone       Visit Diagnosis: Stiffness of left elbow, not elsewhere classified  Stiffness of left hand, not elsewhere classified  Other lack of coordination  Muscle weakness (generalized)  History of falling    Problem List Patient Active Problem List   Diagnosis Date Noted  . Acute pain of left shoulder 10/13/2017  . Essential hypertension 10/12/2017  . Elevated cholesterol with elevated triglycerides 10/12/2017  . Controlled type 2 diabetes mellitus without complication, without long-term current use of insulin ([place]) 10/12/2017  . Stenosis of left carotid artery 10/12/2017  . Left arm pain 10/12/2017  . Glaucoma 10/12/2017  . 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 Alcohol use 10/12/2017  . Morbid obesity ([place]) 10/12/2017     Kathrine Cords, OTR/L, [place] 07/20/2020, 12:21 PM  [facility]. [place], [place], [address] Phone: [phone]   Fax:  [phone]  Name: Indigo Chaddock MRN: [id number] Date of Birth: 1956/03/04

## 2020-07-27 ENCOUNTER — Other Ambulatory Visit: Payer: Self-pay

## 2020-07-27 ENCOUNTER — Ambulatory Visit: Payer: Medicare Other | Admitting: Occupational Therapy

## 2020-07-27 ENCOUNTER — Encounter: Payer: Self-pay | Admitting: Occupational Therapy

## 2020-07-27 DIAGNOSIS — R278 Other lack of coordination: Secondary | ICD-10-CM

## 2020-07-27 DIAGNOSIS — M6281 Muscle weakness (generalized): Secondary | ICD-10-CM

## 2020-07-27 DIAGNOSIS — Z9181 History of falling: Secondary | ICD-10-CM

## 2020-07-27 DIAGNOSIS — M25622 Stiffness of left elbow, not elsewhere classified: Secondary | ICD-10-CM

## 2020-07-27 DIAGNOSIS — M25642 Stiffness of left hand, not elsewhere classified: Secondary | ICD-10-CM

## 2020-07-28 NOTE — Therapy (Signed)
[place]. [place], [place], [address] Phone: [phone]   Fax:  [phone]  Occupational Therapy Treatment  Patient Details  Name: Nathaniel Proctor MRN: [id number] Date of Birth: 10-02-1955 Referring Provider (OT): Laurena Slimmer, MD   Encounter Date: 07/27/2020   OT End of Session - 07/27/20 0938    Visit Number 3    Number of Visits 9    Date for OT Re-Evaluation 09/07/20    Authorization Type [employer]    OT Start Time 0935   Previous session ran over   OT Stop Time 1015    OT Time Calculation (min) 40 min    Activity Tolerance Patient tolerated treatment well    Behavior During Therapy [facility] for tasks assessed/performed           Past Medical History:  Diagnosis Date  . Allergy   . Depression   . Dystonia    left side  . Glaucoma   . Hx of seasonal allergies   . Hypercholesterolemia   . Hypertension     Past Surgical History:  Procedure Laterality Date  . COLONOSCOPY    . COLONOSCOPY WITH PROPOFOL N/A 11/27/2014   Procedure: COLONOSCOPY WITH PROPOFOL;  Surgeon: Garlan Fair, MD;  Location: [facility];  Service: Endoscopy;  Laterality: N/A;  . Left wrist tendon repair Left 2000   [place]  . POLYPECTOMY    . VASECTOMY      There were no vitals filed for this visit.   Subjective Assessment - 07/27/20 1615    Subjective  Pt stated that the L hand has "been a little more open this morning" and that he would be open to trying a splint/brace    Pertinent History Arthritis, neuropathy, gait/balance deficits, Type 2 DM, glaucoma, lupus, and HLD and HTN    Currently in Pain? No/denies            OT Treatments/Exercises (OP) - 07/27/20 1626      ADLs   UB Dressing Min A to zip up jacket; pt demo'd difficulty with targeting when attempting to thread zipper, but was able to stabilize bottom of jacket with one hand and zip up with the other w/out assist once zipper was  threaded   Zipper pull device was attempted; pt demo'd increased success w/out AE     Neurological Re-education Exercises   Other Grasp and Release Exercises  Grasp and release w/ L hand of standard cones, retrieving each (5x) and placing it on another stack; OT instructed pt to focus on extending fingers w/out demonstrating compensatory pattern at L shoulder and success improved with repetition      Splinting   Splinting OT discussed potential benefit of splinting      Manual Therapy   Passive ROM Passive stretching of composite finger extension completed to decrease stiffness and improve ROM   OT instructed pt to hold position at end range and resist finger flexion, which improved w/ repetition; pt was able to maintain neutral position after last 2 reps           OT Education - 07/28/20 1612    Education Details Education provided on potential use of an orthosis to attempt to prevent further contractures of the hand/fingers as well as potential benefit of following up with an optometrist due to recent progression of balance deficits    Person(s) Educated Patient    Methods Explanation    Comprehension Verbalized understanding  OT Short Term Goals - 07/20/20 1206      OT SHORT TERM GOAL #1   Title Pt will verbalize understanding of at least 2 safety/fall prevention strategies to improve balance and safety with in-home functional mobility    Baseline Recurrent falls; decreased knowledge of fall prevention/safety strategies    Time 4    Period Weeks    Status On-going    Target Date 08/10/20      OT SHORT TERM GOAL #2   Title Pt will be able to complete initial HEP designed for LUE ROM independently    Baseline No current HEP    Time 4    Status On-going      OT SHORT TERM GOAL #3   Title Pt will be Mod I with opening medication bottles in at least 2/4 trials    Baseline Requires assist from wife to open most medication bottles    Time 4    Period Weeks     Status Deferred   Pt expressed lack of interest in wanting to work toward this goal.           OT Long Term Goals - 07/20/20 1207      OT LONG TERM GOAL #1   Title Pt will demonstrate understanding of compensatory strategies, including use of assistive devices, 100% of the time to improve participation in ADLs    Baseline No consistent compensatory strategies/AE for more challenging ADL tasks    Time 8    Period Weeks    Status On-going      OT LONG TERM GOAL #2   Title Pt will be Mod I with UB/LB dressing of non-stretch clothes/clothes with fasteners using adaptive techniques/assistive devices as needed    Baseline Currently requires assistance from wife to don/doff non-stretch clothing or items with fasteners    Time 8    Period Weeks    Status On-going      OT LONG TERM GOAL #3   Title Pt will be Mod I with cutting food at least 50% of the time    Baseline Unable to cut tougher foods at this time    Time 8    Period Weeks    Status On-going      OT LONG TERM GOAL #4   Title Per self-report, pt will be independent with home carryover of HEP designed to improve functional use of LUE    Baseline No current HEP    Time 8    Period Weeks    Status On-going            Plan - 07/27/20 1618    Clinical Impression Statement Pt demo'd improved ability to maintain finger extension after OT-facilitated PROM and stretching. Pt unable to achieve three-point pinch, but is moderately effective with lateral key pinch prehension. Grasp and release with full grasp continues to be significantly limitied.    OT Occupational Profile and History Detailed Assessment- Review of Records and additional review of physical, cognitive, psychosocial history related to current functional performance    Occupational performance deficits (Please refer to evaluation for details): ADL's;IADL's;Leisure    Body Structure / Function / Physical Skills ADL;ROM;UE functional use;Balance;Decreased knowledge of  use of DME;FMC;GMC;Body mechanics;Gait;Pain;Strength;Coordination;IADL;Tone    Rehab Potential Good    Clinical Decision Making Several treatment options, min-mod task modification necessary    Comorbidities Affecting Occupational Performance: Presence of comorbidities impacting occupational performance    Modification or Assistance to Complete Evaluation  Min-Moderate modification of  tasks or assist with assess necessary to complete eval    OT Frequency 1x / week    OT Duration 8 weeks    OT Treatment/Interventions Self-care/ADL training;Electrical Stimulation;Therapeutic exercise;Moist Heat;Paraffin;Neuromuscular education;Patient/family education;Splinting;Compression bandaging;Energy conservation;Fluidtherapy;Therapeutic activities;Balance training;Cryotherapy;DME and/or AE instruction;Ultrasound;Manual Therapy;Passive range of motion    Plan Continue stretching for LUE (measure PROM); functional grasp and release    Consulted and Agree with Plan of Care Patient           Patient will benefit from skilled therapeutic intervention in order to improve the following deficits and impairments:   Body Structure / Function / Physical Skills: ADL,ROM,UE functional use,Balance,Decreased knowledge of use of DME,FMC,GMC,Body mechanics,Gait,Pain,Strength,Coordination,IADL,Tone       Visit Diagnosis: Stiffness of left elbow, not elsewhere classified  Stiffness of left hand, not elsewhere classified  Other lack of coordination  Muscle weakness (generalized)  History of falling    Problem List Patient Active Problem List   Diagnosis Date Noted  . Acute pain of left shoulder 10/13/2017  . Essential hypertension 10/12/2017  . Elevated cholesterol with elevated triglycerides 10/12/2017  . Controlled type 2 diabetes mellitus without complication, without long-term current use of insulin ([place]) 10/12/2017  . Stenosis of left carotid artery 10/12/2017  . Left arm pain 10/12/2017  . Glaucoma  10/12/2017  . 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 Alcohol use 10/12/2017  . Morbid obesity ([place]) 10/12/2017     Kathrine Cords, OTR/L, [place] 07/28/2020, 4:43 PM  [facility] [place]. [place], [place], [address] Phone: [phone]   Fax:  [phone]  Name: Paris Chiriboga MRN: [id number] Date of Birth: [birth year]

## 2020-08-03 ENCOUNTER — Encounter: Payer: Self-pay | Admitting: Occupational Therapy

## 2020-08-03 ENCOUNTER — Other Ambulatory Visit: Payer: Self-pay

## 2020-08-03 ENCOUNTER — Ambulatory Visit: Payer: Medicare Other | Attending: Unknown Physician Specialty | Admitting: Occupational Therapy

## 2020-08-03 DIAGNOSIS — M25622 Stiffness of left elbow, not elsewhere classified: Secondary | ICD-10-CM | POA: Diagnosis present

## 2020-08-03 DIAGNOSIS — M6281 Muscle weakness (generalized): Secondary | ICD-10-CM | POA: Insufficient documentation

## 2020-08-03 DIAGNOSIS — R278 Other lack of coordination: Secondary | ICD-10-CM | POA: Diagnosis present

## 2020-08-03 DIAGNOSIS — M25642 Stiffness of left hand, not elsewhere classified: Secondary | ICD-10-CM | POA: Insufficient documentation

## 2020-08-03 DIAGNOSIS — Z9181 History of falling: Secondary | ICD-10-CM | POA: Insufficient documentation

## 2020-08-03 NOTE — Therapy (Signed)
[facility] [place]. [place], [place], [address] Phone: [phone]   Fax:  [phone]  Occupational Therapy Treatment  Patient Details  Name: Nathaniel Proctor MRN: [id number] Date of Birth: 11-08-1955 Referring Provider (OT): Laurena Slimmer, MD   Encounter Date: 08/03/2020   OT End of Session - 08/03/20 0942    Visit Number 4    Number of Visits 9    Date for OT Re-Evaluation 09/07/20    Authorization Type [employer]    OT Start Time 0930    OT Stop Time 1016    OT Time Calculation (min) 46 min    Activity Tolerance Patient tolerated treatment well    Behavior During Therapy [facility] for tasks assessed/performed           Past Medical History:  Diagnosis Date  . Allergy   . Depression   . Dystonia    left side  . Glaucoma   . Hx of seasonal allergies   . Hypercholesterolemia   . Hypertension     Past Surgical History:  Procedure Laterality Date  . COLONOSCOPY    . COLONOSCOPY WITH PROPOFOL N/A 11/27/2014   Procedure: COLONOSCOPY WITH PROPOFOL;  Surgeon: Garlan Fair, MD;  Location: [facility];  Service: Endoscopy;  Laterality: N/A;  . Left wrist tendon repair Left 2000   [place]  . POLYPECTOMY    . VASECTOMY      There were no vitals filed for this visit.   Subjective Assessment - 08/03/20 0940    Subjective  Pt stated, "I really think these will work for me" when talking about the single-handed cutting board and adaptive utensils.    Pertinent History Arthritis, neuropathy, gait/balance deficits, Type 2 DM, glaucoma, lupus, and HLD and HTN    Currently in Pain? No/denies            OT Treatments/Exercises (OP) - 08/03/20 1140      ADLs   Cooking OT provided demonstration of AE for simulated meal prep/cutting food w/ one-handed techniques; pt completed practice w/ adaptive cutting board (handout provided for pt to order at home), adaptive/build-up fork due to decreased grip, and  rocker knife   Pt able to cut food in simulation w/ Mod I and demonstrated understanding of each device; will trial at home     Neurological Re-education Exercises   Other Exercises 1 Pt instructed to use lateral key pinch to open clothespin and then release; completed 10x w/ L hand to facilitate functional pinch prehension   Lateral key pinch w/ pinch gauge measured at 8lbs after practice of pinch and release     Manual Therapy   Passive ROM PROM of composite finger extension and wrist extension to decrease stiffness and improve ROM; holding stretch at end-range   Significant tightness that decreased w/ repetition           OT Short Term Goals - 08/03/20 1134      OT SHORT TERM GOAL #1   Title Pt will verbalize understanding of at least 2 safety/fall prevention strategies to improve balance and safety with in-home functional mobility    Baseline Recurrent falls; decreased knowledge of fall prevention/safety strategies    Time 4    Period Weeks    Status On-going    Target Date 08/10/20      OT SHORT TERM GOAL #2   Title Pt will be able to complete initial HEP designed for LUE ROM independently  Baseline No current HEP    Time 4    Status On-going      OT SHORT TERM GOAL #3   Title Pt will be independent with wear and care of orthosis for positioning and prevention of contracture    Baseline No orthosis at this time    Time 2    Period Weeks    Status New   Pt expressed lack of interest in wanting to work toward this goal.   Target Date 08/17/20            OT Long Term Goals - 07/20/20 1207      OT LONG TERM GOAL #1   Title Pt will demonstrate understanding of compensatory strategies, including use of assistive devices, 100% of the time to improve participation in ADLs    Baseline No consistent compensatory strategies/AE for more challenging ADL tasks    Time 8    Period Weeks    Status On-going      OT LONG TERM GOAL #2   Title Pt will be Mod I with UB/LB dressing  of non-stretch clothes/clothes with fasteners using adaptive techniques/assistive devices as needed    Baseline Currently requires assistance from wife to don/doff non-stretch clothing or items with fasteners    Time 8    Period Weeks    Status On-going      OT LONG TERM GOAL #3   Title Pt will be Mod I with cutting food at least 50% of the time    Baseline Unable to cut tougher foods at this time    Time 8    Period Weeks    Status On-going      OT LONG TERM GOAL #4   Title Per self-report, pt will be independent with home carryover of HEP designed to improve functional use of LUE    Baseline No current HEP    Time 8    Period Weeks    Status On-going            Plan - 08/03/20 1126    Clinical Impression Statement Single-handed adaptive strategies for meal prep/eating tasks appeared to work well for pt and OT provided handout for pt to order devices for use at home. Manual PROM stretching of wrist, hand, and fingers, as well as cueing for pacing and extra time continue to be beneficial. Due to decreased ROM and stiffness in the wrist and hand, OT discussed fabricating a custom resting hand splint for use at home.    OT Occupational Profile and History Detailed Assessment- Review of Records and additional review of physical, cognitive, psychosocial history related to current functional performance    Occupational performance deficits (Please refer to evaluation for details): ADL's;IADL's;Leisure    Body Structure / Function / Physical Skills ADL;ROM;UE functional use;Balance;Decreased knowledge of use of DME;FMC;GMC;Body mechanics;Gait;Pain;Strength;Coordination;IADL;Tone    Rehab Potential Good    Clinical Decision Making Several treatment options, min-mod task modification necessary    Comorbidities Affecting Occupational Performance: Presence of comorbidities impacting occupational performance    Modification or Assistance to Complete Evaluation  Min-Moderate modification of tasks  or assist with assess necessary to complete eval    OT Frequency 1x / week    OT Duration 8 weeks    OT Treatment/Interventions Self-care/ADL training;Electrical Stimulation;Therapeutic exercise;Moist Heat;Paraffin;Neuromuscular education;Patient/family education;Splinting;Compression bandaging;Energy conservation;Fluidtherapy;Therapeutic activities;Balance training;Cryotherapy;DME and/or AE instruction;Ultrasound;Manual Therapy;Passive range of motion    Plan Custom resting hand orthosis if approved by physician    Consulted and Agree with Plan  of Care Patient           Patient will benefit from skilled therapeutic intervention in order to improve the following deficits and impairments:   Body Structure / Function / Physical Skills: ADL,ROM,UE functional use,Balance,Decreased knowledge of use of DME,FMC,GMC,Body mechanics,Gait,Pain,Strength,Coordination,IADL,Tone       Visit Diagnosis: Stiffness of left elbow, not elsewhere classified  Stiffness of left hand, not elsewhere classified  Other lack of coordination  Muscle weakness (generalized)  History of falling    Problem List Patient Active Problem List   Diagnosis Date Noted  . Acute pain of left shoulder 10/13/2017  . Essential hypertension 10/12/2017  . Elevated cholesterol with elevated triglycerides 10/12/2017  . Controlled type 2 diabetes mellitus without complication, without long-term current use of insulin ([place]) 10/12/2017  . Stenosis of left carotid artery 10/12/2017  . Left arm pain 10/12/2017  . Glaucoma 10/12/2017  . 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 Alcohol use 10/12/2017  . Morbid obesity ([place]) 10/12/2017     Kathrine Cords, OTR/L, [place] 08/03/2020, 11:48 AM  [place]. [place], [place], [address] Phone: [phone]   Fax:  [phone]  Name: Nathaniel Proctor MRN: [id number] Date of Birth: Dec 28, 1955

## 2020-08-10 ENCOUNTER — Encounter: Payer: Self-pay | Admitting: Occupational Therapy

## 2020-08-10 ENCOUNTER — Other Ambulatory Visit: Payer: Self-pay

## 2020-08-10 ENCOUNTER — Ambulatory Visit: Payer: Medicare Other | Admitting: Occupational Therapy

## 2020-08-10 DIAGNOSIS — M6281 Muscle weakness (generalized): Secondary | ICD-10-CM

## 2020-08-10 DIAGNOSIS — M25622 Stiffness of left elbow, not elsewhere classified: Secondary | ICD-10-CM | POA: Diagnosis not present

## 2020-08-10 DIAGNOSIS — Z9181 History of falling: Secondary | ICD-10-CM

## 2020-08-10 DIAGNOSIS — R278 Other lack of coordination: Secondary | ICD-10-CM

## 2020-08-10 DIAGNOSIS — M25642 Stiffness of left hand, not elsewhere classified: Secondary | ICD-10-CM

## 2020-08-11 NOTE — Therapy (Signed)
[facility]. [place], [place], [address] Phone: [phone]   Fax:  [phone]  Occupational Therapy Treatment  Patient Details  Name: Nathaniel Proctor MRN: [id number] Date of Birth: 1955-10-18 Referring Provider (OT): Laurena Slimmer, MD   Encounter Date: 08/10/2020   OT End of Session - 08/10/20 0806    Visit Number 5    Number of Visits 9    Date for OT Re-Evaluation 09/07/20    Authorization Type [employer]    OT Start Time 0800    OT Stop Time 0900    OT Time Calculation (min) 60 min    Activity Tolerance Patient tolerated treatment well    Behavior During Therapy [facility] for tasks assessed/performed           Past Medical History:  Diagnosis Date  . Allergy   . Depression   . Dystonia    left side  . Glaucoma   . Hx of seasonal allergies   . Hypercholesterolemia   . Hypertension     Past Surgical History:  Procedure Laterality Date  . COLONOSCOPY    . COLONOSCOPY WITH PROPOFOL N/A 11/27/2014   Procedure: COLONOSCOPY WITH PROPOFOL;  Surgeon: Garlan Fair, MD;  Location: [facility];  Service: Endoscopy;  Laterality: N/A;  . Left wrist tendon repair Left 2000   [place]  . POLYPECTOMY    . VASECTOMY      There were no vitals filed for this visit.   Subjective Assessment - 08/10/20 0831    Subjective  Pt reports that at the onset of his symptoms his LUE became progressively stiffer and that his balance also became progressively worse    Pertinent History Arthritis, neuropathy, gait/balance deficits, Type 2 DM, glaucoma, lupus, and HLD and HTN    Currently in Pain? 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           OT Short Term Goals - 08/10/20 0973      OT SHORT TERM GOAL #1   Title Pt will verbalize understanding of at least 2 safety/fall prevention strategies to improve balance and safety with in-home functional mobility    Baseline Recurrent falls; decreased knowledge of fall prevention/safety strategies    Time 4    Period Weeks    Status On-going    Target Date 08/10/20      OT SHORT TERM GOAL #2   Title Pt will be able to complete initial HEP designed for LUE ROM independently    Baseline No current HEP    Time 4    Status Achieved   08/10/20 - Pt reports compliance at home w/ initial HEP designed for LUE     OT SHORT TERM GOAL #3   Title Pt will be independent with wear and care of orthosis for positioning and management of deformity    Baseline No orthosis at this time    Time 2  Period Weeks    Status On-going    Target Date 08/17/20            OT Long Term Goals - 07/20/20 1207      OT LONG TERM GOAL #1   Title Pt will demonstrate understanding of compensatory strategies, including use of assistive devices, 100% of the time to improve participation in ADLs    Baseline No consistent compensatory strategies/AE for more challenging ADL tasks    Time 8    Period Weeks    Status On-going      OT LONG TERM GOAL #2   Title Pt will be Mod I with UB/LB dressing of non-stretch clothes/clothes with fasteners using adaptive techniques/assistive devices as needed    Baseline Currently requires assistance from wife to don/doff non-stretch clothing or items with fasteners    Time 8    Period Weeks    Status On-going      OT LONG TERM GOAL #3   Title Pt will be Mod I with cutting food at least 50% of the time    Baseline Unable to cut tougher  foods at this time    Time 8    Period Weeks    Status On-going      OT LONG TERM GOAL #4   Title Per self-report, pt will be independent with home carryover of HEP designed to improve functional use of LUE    Baseline No current HEP    Time 8    Period Weeks    Status On-going            Plan - 08/10/20 9562    Clinical Impression Statement OT initiated fabrication of custom orthosis to prevent further deformity of L wrist and hand; due to necessity for further fit adjustments, pt was not administered orthosis at this time. OT facilitated PROM of LUE prior to fabrication to increase mobility for initial fit of orthosis; hypertonia of L wrist, hand, and digits was limiting during fabrication of orthosis.    OT Occupational Profile and History Detailed Assessment- Review of Records and additional review of physical, cognitive, psychosocial history related to current functional performance    Occupational performance deficits (Please refer to evaluation for details): ADL's;IADL's;Leisure    Body Structure / Function / Physical Skills ADL;ROM;UE functional use;Balance;Decreased knowledge of use of DME;FMC;GMC;Body mechanics;Gait;Pain;Strength;Coordination;IADL;Tone    Rehab Potential Good    Clinical Decision Making Several treatment options, min-mod task modification necessary    Comorbidities Affecting Occupational Performance: Presence of comorbidities impacting occupational performance    Modification or Assistance to Complete Evaluation  Min-Moderate modification of tasks or assist with assess necessary to complete eval    OT Frequency 1x / week    OT Duration 8 weeks    OT Treatment/Interventions Self-care/ADL training;Electrical Stimulation;Therapeutic exercise;Moist Heat;Paraffin;Neuromuscular education;Patient/family education;Splinting;Compression bandaging;Energy conservation;Fluidtherapy;Therapeutic activities;Balance training;Cryotherapy;DME and/or AE  instruction;Ultrasound;Manual Therapy;Passive range of motion    Plan Orthosis to prevent deformity    Consulted and Agree with Plan of Care Patient           Patient will benefit from skilled therapeutic intervention in order to improve the following deficits and impairments:   Body Structure / Function / Physical Skills: ADL,ROM,UE functional use,Balance,Decreased knowledge of use of DME,FMC,GMC,Body mechanics,Gait,Pain,Strength,Coordination,IADL,Tone       Visit Diagnosis: Stiffness of left elbow, not elsewhere classified  Stiffness of left hand, not elsewhere classified  Other lack of coordination  Muscle weakness (generalized)  History of falling    Problem List  Patient Active Problem List   Diagnosis Date Noted  . Acute pain of left shoulder 10/13/2017  . Essential hypertension 10/12/2017  . Elevated cholesterol with elevated triglycerides 10/12/2017  . Controlled type 2 diabetes mellitus without complication, without long-term current use of insulin ([place]) 10/12/2017  . Stenosis of left carotid artery 10/12/2017  . Left arm pain 10/12/2017  . Glaucoma 10/12/2017  . 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 Alcohol use 10/12/2017  . Morbid obesity ([place]) 10/12/2017     Kathrine Cords, OTR/L, [place] 08/11/2020, 11:12 AM  [facility]. [place], [place], [address] Phone: [phone]   Fax:  [phone]  Name: Filippo Puls MRN: [id number] Date of Birth: [birth year]

## 2020-08-17 ENCOUNTER — Ambulatory Visit: Payer: Medicare Other | Admitting: Occupational Therapy

## 2020-08-17 ENCOUNTER — Other Ambulatory Visit: Payer: Self-pay

## 2020-08-17 ENCOUNTER — Encounter: Payer: Self-pay | Admitting: Occupational Therapy

## 2020-08-17 DIAGNOSIS — M25642 Stiffness of left hand, not elsewhere classified: Secondary | ICD-10-CM

## 2020-08-17 DIAGNOSIS — M25622 Stiffness of left elbow, not elsewhere classified: Secondary | ICD-10-CM

## 2020-08-17 DIAGNOSIS — M6281 Muscle weakness (generalized): Secondary | ICD-10-CM

## 2020-08-17 DIAGNOSIS — R278 Other lack of coordination: Secondary | ICD-10-CM

## 2020-08-17 DIAGNOSIS — Z9181 History of falling: Secondary | ICD-10-CM

## 2020-08-17 NOTE — Patient Instructions (Signed)
PROM: Wrist Flexion / Extension     Grasp right hand and slowly bend wrist until stretch is felt. Relax. Then stretch as far as possible in opposite direction. Hold for 3-5 sec. Repeat __10__ times per set. Do __2__ sets per session. Do __2-3__ sessions per day.  Copyright  [employer]. All rights reserved.

## 2020-08-17 NOTE — Therapy (Signed)
[facility]. [place], [place], [address] Phone: [phone]   Fax:  [phone]  Occupational Therapy Treatment  Patient Details  Name: Nathaniel Proctor MRN: [id number] Date of Birth: Jul 07, 1955 Referring Provider (OT): Laurena Slimmer, MD   Encounter Date: 08/17/2020   OT End of Session - 08/17/20 1203    Visit Number 6    Number of Visits 9    Date for OT Re-Evaluation 09/07/20    Authorization Type [employer]    OT Start Time 0930    OT Stop Time 1014    OT Time Calculation (min) 44 min    Activity Tolerance Patient tolerated treatment well    Behavior During Therapy [facility] for tasks assessed/performed           Past Medical History:  Diagnosis Date  . Allergy   . Depression   . Dystonia    left side  . Glaucoma   . Hx of seasonal allergies   . Hypercholesterolemia   . Hypertension     Past Surgical History:  Procedure Laterality Date  . COLONOSCOPY    . COLONOSCOPY WITH PROPOFOL N/A 11/27/2014   Procedure: COLONOSCOPY WITH PROPOFOL;  Surgeon: Garlan Fair, MD;  Location: [facility];  Service: Endoscopy;  Laterality: N/A;  . Left wrist tendon repair Left 2000   [place]  . POLYPECTOMY    . VASECTOMY      There were no vitals filed for this visit.   Subjective Assessment - 08/17/20 0936    Subjective  Pt states he received adaptive cutting board and that "it is really cool and has been helping a lot." Pt also reports that his appt w/ an optometrist is next week.    Pertinent History Arthritis, neuropathy, gait/balance deficits, Type 2 DM, glaucoma, lupus, and HLD and HTN    Currently in Pain? No/denies            OT Treatments/Exercises (OP) - 08/17/20 1006      Neurological Re-education Exercises   Wrist Extension PROM;Left   Difficulty achieving L wrist ext using R hand. OT trailed pt at countertop, but pt unable to achieve sufficient forearm pro; next trial using  door frame, which improved success, but inconsistently; OT instructed pt through self-PROM again with success     Modalities   Modalities Electrical engineer Stimulation Location Forearm    Electrical Stimulation Action Wrist/finger extension    Electrical Stimulation Parameters Unable to achieve extension/pt reported not feeling stimulation with attempted parameters; activity was d/c due to time and will be attempted again next session    Electrical Stimulation Goals Neuromuscular facilitation      Splinting   Splinting OT discussed alternative options for splinting to prevent spasticity and deformity w/ pt      Manual Therapy   Manual Therapy Passive ROM    Passive ROM OT-facilitated PROM of wrist extension, holding stretch at end range, to decrease stiffness and improve ROM; OT able to achieve slightly greater than neutral wrist extension w/ repetition            OT Education - 08/17/20 1201    Education Details Education provided on most effective positioning to stretch in wrist extension at home, as well as on NMES    Person(s) Educated Patient    Methods Explanation;Handout;Demonstration    Comprehension Verbalized understanding;Returned demonstration  OT Short Term Goals - 08/10/20 0832      OT SHORT TERM GOAL #1   Title Pt will verbalize understanding of at least 2 safety/fall prevention strategies to improve balance and safety with in-home functional mobility    Baseline Recurrent falls; decreased knowledge of fall prevention/safety strategies    Time 4    Period Weeks    Status On-going    Target Date 08/10/20      OT SHORT TERM GOAL #2   Title Pt will be able to complete initial HEP designed for LUE ROM independently    Baseline No current HEP    Time 4    Status Achieved   08/10/20 - Pt reports compliance at home w/ initial HEP designed for LUE     OT SHORT TERM GOAL #3   Title Pt will be independent  with wear and care of orthosis for positioning and management of deformity    Baseline No orthosis at this time    Time 2    Period Weeks    Status On-going    Target Date 08/17/20            OT Long Term Goals - 07/20/20 1207      OT LONG TERM GOAL #1   Title Pt will demonstrate understanding of compensatory strategies, including use of assistive devices, 100% of the time to improve participation in ADLs    Baseline No consistent compensatory strategies/AE for more challenging ADL tasks    Time 8    Period Weeks    Status On-going      OT LONG TERM GOAL #2   Title Pt will be Mod I with UB/LB dressing of non-stretch clothes/clothes with fasteners using adaptive techniques/assistive devices as needed    Baseline Currently requires assistance from wife to don/doff non-stretch clothing or items with fasteners    Time 8    Period Weeks    Status On-going      OT LONG TERM GOAL #3   Title Pt will be Mod I with cutting food at least 50% of the time    Baseline Unable to cut tougher foods at this time    Time 8    Period Weeks    Status On-going      OT LONG TERM GOAL #4   Title Per self-report, pt will be independent with home carryover of HEP designed to improve functional use of LUE    Baseline No current HEP    Time 8    Period Weeks    Status On-going            Plan - 08/17/20 1204    Clinical Impression Statement Due to ineffectiveness of initial orthosis for decreased spasticity and deformity, OT discussed different options to trial w/ pt; pt was receptive. Pt also reports that his hand has seemed to improve with him completing his stretches at home, so OT problem-solved w/ pt to find most effective means of achieving wrist extension stretch. Pt attempted stretch using his R hand, using countertop, and using a doorframe; most effective method for self-PROM appeared to be using his RUE and handout was provided for pt to complete exercise at home. NMES also attempted, but  due to pt reporting not feeling stimulation and being unable to achieve finger extension, modality was d/c.    OT Occupational Profile and History Detailed Assessment- Review of Records and additional review of physical, cognitive, psychosocial history related to current functional performance  Occupational performance deficits (Please refer to evaluation for details): ADL's;IADL's;Leisure    Body Structure / Function / Physical Skills ADL;ROM;UE functional use;Balance;Decreased knowledge of use of DME;FMC;GMC;Body mechanics;Gait;Pain;Strength;Coordination;IADL;Tone    Rehab Potential Good    Clinical Decision Making Several treatment options, min-mod task modification necessary    Comorbidities Affecting Occupational Performance: Presence of comorbidities impacting occupational performance    Modification or Assistance to Complete Evaluation  Min-Moderate modification of tasks or assist with assess necessary to complete eval    OT Frequency 1x / week    OT Duration 8 weeks    OT Treatment/Interventions Self-care/ADL training;Electrical Stimulation;Therapeutic exercise;Moist Heat;Paraffin;Neuromuscular education;Patient/family education;Splinting;Compression bandaging;Energy conservation;Fluidtherapy;Therapeutic activities;Balance training;Cryotherapy;DME and/or AE instruction;Ultrasound;Manual Therapy;Passive range of motion    Plan NMES; trial alternative orthosis; continue stretching    Consulted and Agree with Plan of Care Patient           Patient will benefit from skilled therapeutic intervention in order to improve the following deficits and impairments:   Body Structure / Function / Physical Skills: ADL,ROM,UE functional use,Balance,Decreased knowledge of use of DME,FMC,GMC,Body mechanics,Gait,Pain,Strength,Coordination,IADL,Tone       Visit Diagnosis: Stiffness of left elbow, not elsewhere classified  Stiffness of left hand, not elsewhere classified  Other lack of  coordination  Muscle weakness (generalized)  History of falling    Problem List Patient Active Problem List   Diagnosis Date Noted  . Acute pain of left shoulder 10/13/2017  . Essential hypertension 10/12/2017  . Elevated cholesterol with elevated triglycerides 10/12/2017  . Controlled type 2 diabetes mellitus without complication, without long-term current use of insulin ([place]) 10/12/2017  . Stenosis of left carotid artery 10/12/2017  . Left arm pain 10/12/2017  . Glaucoma 10/12/2017  . 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 Alcohol use 10/12/2017  . Morbid obesity ([place]) 10/12/2017     Kathrine Cords, OTR/L, [place] 08/17/2020, 12:22 PM  [facility]. [place], [place], [address] Phone: [phone]   Fax:  [phone]  Name: Nathaniel Proctor MRN: [id number] Date of Birth: 1956-01-20

## 2020-08-24 ENCOUNTER — Other Ambulatory Visit: Payer: Self-pay

## 2020-08-24 ENCOUNTER — Ambulatory Visit: Payer: Medicare Other | Admitting: Occupational Therapy

## 2020-08-24 ENCOUNTER — Encounter: Payer: Self-pay | Admitting: Occupational Therapy

## 2020-08-24 DIAGNOSIS — M6281 Muscle weakness (generalized): Secondary | ICD-10-CM

## 2020-08-24 DIAGNOSIS — M25622 Stiffness of left elbow, not elsewhere classified: Secondary | ICD-10-CM

## 2020-08-24 DIAGNOSIS — R278 Other lack of coordination: Secondary | ICD-10-CM

## 2020-08-24 DIAGNOSIS — M25642 Stiffness of left hand, not elsewhere classified: Secondary | ICD-10-CM

## 2020-08-24 DIAGNOSIS — Z9181 History of falling: Secondary | ICD-10-CM

## 2020-08-24 NOTE — Therapy (Signed)
[facility]. [place], [place], [address] Phone: [phone]   Fax:  [phone]  Occupational Therapy Treatment  Patient Details  Name: Nathaniel Proctor MRN: [id number] Date of Birth: 16-Apr-1956 Referring Provider (OT): Laurena Slimmer, MD   Encounter Date: 08/24/2020   OT End of Session - 08/24/20 0943    Visit Number 7    Number of Visits 9    Date for OT Re-Evaluation 09/07/20    Authorization Type [employer]    OT Start Time 0930    OT Stop Time 1015    OT Time Calculation (min) 45 min    Activity Tolerance Patient tolerated treatment well    Behavior During Therapy [facility] for tasks assessed/performed           Past Medical History:  Diagnosis Date  . Allergy   . Depression   . Dystonia    left side  . Glaucoma   . Hx of seasonal allergies   . Hypercholesterolemia   . Hypertension     Past Surgical History:  Procedure Laterality Date  . COLONOSCOPY    . COLONOSCOPY WITH PROPOFOL N/A 11/27/2014   Procedure: COLONOSCOPY WITH PROPOFOL;  Surgeon: Garlan Fair, MD;  Location: [facility];  Service: Endoscopy;  Laterality: N/A;  . Left wrist tendon repair Left 2000   [place]  . POLYPECTOMY    . VASECTOMY      There were no vitals filed for this visit.   Subjective Assessment - 08/24/20 0932    Subjective  Pt reports he is excited to go to his appt w/ the optometrist later today    Pertinent History Arthritis, neuropathy, gait/balance deficits, Type 2 DM, glaucoma, lupus, and HLD and HTN    Currently in Pain? No/denies            OT Treatments/Exercises (OP) - 08/24/20 1001      ADLs   Functional Mobility OT discussed functional mobility and safety during ambulation w/ pt; pt is no longer using tripod cane and bumped into wall on R side 1x and L side 1x; OT provided safety strategy recommendation for in-home mobility and for ambulation on uneven surfaces   TUG (14 sec); TUG  Manual (21 sec)     Electrical Stimulation   Electrical Stimulation Location Forearm    Electrical Stimulation Action Finger extension    Electrical Stimulation Parameters VMS; 4/12 cycle time; 50 pps frequency; 5 sec ramp time; 100 usec phase duration; 35 mA CC amplitude   Able to achieve finger extension 5x; pt reported no pain/discomfort   Electrical Stimulation Goals Neuromuscular facilitation      Manual Therapy   Manual Therapy Passive ROM    Passive ROM OT-facilitated PROM of finger extension, wrist extension, and elbow flex/ext, holding stretch at end range in order to decrease stiffness and improve ROM; PROM increased slightly w/ repetition and OT able to achieve almost full elbow extension            OT Short Term Goals - 08/10/20 7902      OT SHORT TERM GOAL #1   Title Pt will verbalize understanding of at least 2 safety/fall prevention strategies to improve balance and safety with in-home functional mobility    Baseline Recurrent falls; decreased knowledge of fall prevention/safety strategies    Time 4    Period Weeks    Status On-going    Target Date 08/10/20      OT SHORT  TERM GOAL #2   Title Pt will be able to complete initial HEP designed for LUE ROM independently    Baseline No current HEP    Time 4    Status Achieved   08/10/20 - Pt reports compliance at home w/ initial HEP designed for LUE     OT SHORT TERM GOAL #3   Title Pt will be independent with wear and care of orthosis for positioning and management of deformity    Baseline No orthosis at this time    Time 2    Period Weeks    Status On-going    Target Date 08/17/20            OT Long Term Goals - 07/20/20 1207      OT LONG TERM GOAL #1   Title Pt will demonstrate understanding of compensatory strategies, including use of assistive devices, 100% of the time to improve participation in ADLs    Baseline No consistent compensatory strategies/AE for more challenging ADL tasks    Time 8    Period  Weeks    Status On-going      OT LONG TERM GOAL #2   Title Pt will be Mod I with UB/LB dressing of non-stretch clothes/clothes with fasteners using adaptive techniques/assistive devices as needed    Baseline Currently requires assistance from wife to don/doff non-stretch clothing or items with fasteners    Time 8    Period Weeks    Status On-going      OT LONG TERM GOAL #3   Title Pt will be Mod I with cutting food at least 50% of the time    Baseline Unable to cut tougher foods at this time    Time 8    Period Weeks    Status On-going      OT LONG TERM GOAL #4   Title Per self-report, pt will be independent with home carryover of HEP designed to improve functional use of LUE    Baseline No current HEP    Time 8    Period Weeks    Status On-going            Plan - 08/24/20 1046    Clinical Impression Statement Due to pt's concern regarding balance, OT discussed safety strategies during ambulation and completed TUG Test and Manual TUG Test w/ pt, which indicated pt is a fall risk; OT to re-assess and target balance further after follow-up w/ optometrist. OT was able to achieve slight finger extension w/ estim modality this session; due to initial inability to achieve muscle contraction with increased amplitude intensity and amplitude required to achieve slight muscle contraction, estim was completed for approx 2 minutes. Pt encouraged to follow-up if he experiences any adverse reactions.    OT Occupational Profile and History Detailed Assessment- Review of Records and additional review of physical, cognitive, psychosocial history related to current functional performance    Occupational performance deficits (Please refer to evaluation for details): ADL's;IADL's;Leisure    Body Structure / Function / Physical Skills ADL;ROM;UE functional use;Balance;Decreased knowledge of use of DME;FMC;GMC;Body mechanics;Gait;Pain;Strength;Coordination;IADL;Tone    Rehab Potential Good    Clinical  Decision Making Several treatment options, min-mod task modification necessary    Comorbidities Affecting Occupational Performance: Presence of comorbidities impacting occupational performance    Modification or Assistance to Complete Evaluation  Min-Moderate modification of tasks or assist with assess necessary to complete eval    OT Frequency 1x / week    OT Duration 8 weeks  OT Treatment/Interventions Self-care/ADL training;Electrical Stimulation;Therapeutic exercise;Moist Heat;Paraffin;Neuromuscular education;Patient/family education;Splinting;Compression bandaging;Energy conservation;Fluidtherapy;Therapeutic activities;Balance training;Cryotherapy;DME and/or AE instruction;Ultrasound;Manual Therapy;Passive range of motion    Plan Anti-spasticity orthosis; balance activities    Consulted and Agree with Plan of Care Patient           Patient will benefit from skilled therapeutic intervention in order to improve the following deficits and impairments:   Body Structure / Function / Physical Skills: ADL,ROM,UE functional use,Balance,Decreased knowledge of use of DME,FMC,GMC,Body mechanics,Gait,Pain,Strength,Coordination,IADL,Tone       Visit Diagnosis: Stiffness of left elbow, not elsewhere classified  Stiffness of left hand, not elsewhere classified  Other lack of coordination  Muscle weakness (generalized)  History of falling    Problem List Patient Active Problem List   Diagnosis Date Noted  . Acute pain of left shoulder 10/13/2017  . Essential hypertension 10/12/2017  . Elevated cholesterol with elevated triglycerides 10/12/2017  . Controlled type 2 diabetes mellitus without complication, without long-term current use of insulin ([place]) 10/12/2017  . Stenosis of left carotid artery 10/12/2017  . Left arm pain 10/12/2017  . Glaucoma 10/12/2017  . 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 Alcohol use 10/12/2017  . Morbid obesity ([place]) 10/12/2017     Kathrine Cords, OTR/L,  [place] 08/24/2020, 10:59 AM  [facility]. [place], [place], [address] Phone: [phone]   Fax:  [phone]  Name: Nathaniel Proctor MRN: [id number] Date of Birth: January 19, 1956

## 2020-08-31 ENCOUNTER — Ambulatory Visit: Payer: Medicare Other | Admitting: Occupational Therapy

## 2020-09-07 ENCOUNTER — Other Ambulatory Visit: Payer: Self-pay

## 2020-09-07 ENCOUNTER — Ambulatory Visit: Payer: Medicare Other | Attending: Unknown Physician Specialty | Admitting: Occupational Therapy

## 2020-09-07 ENCOUNTER — Encounter: Payer: Self-pay | Admitting: Occupational Therapy

## 2020-09-07 DIAGNOSIS — M25642 Stiffness of left hand, not elsewhere classified: Secondary | ICD-10-CM | POA: Insufficient documentation

## 2020-09-07 DIAGNOSIS — R258 Other abnormal involuntary movements: Secondary | ICD-10-CM | POA: Insufficient documentation

## 2020-09-07 DIAGNOSIS — Z9181 History of falling: Secondary | ICD-10-CM | POA: Insufficient documentation

## 2020-09-07 DIAGNOSIS — R278 Other lack of coordination: Secondary | ICD-10-CM | POA: Insufficient documentation

## 2020-09-07 DIAGNOSIS — M25622 Stiffness of left elbow, not elsewhere classified: Secondary | ICD-10-CM | POA: Diagnosis present

## 2020-09-08 NOTE — Therapy (Signed)
[place]. [place], [place], [address] Phone: [phone]   Fax:  [phone]  Occupational Therapy Treatment  Patient Details  Name: Nathaniel Proctor MRN: [id number] Date of Birth: 04-09-1956 Referring Provider (OT): Laurena Slimmer, MD   Encounter Date: 09/07/2020   OT End of Session - 09/07/20 1000    Visit Number 8    Number of Visits 13    Date for OT Re-Evaluation 10/19/20    Authorization Type [employer]    OT Start Time 0930    OT Stop Time 1015    OT Time Calculation (min) 45 min    Activity Tolerance Patient tolerated treatment well    Behavior During Therapy [facility] for tasks assessed/performed           Past Medical History:  Diagnosis Date  . Allergy   . Depression   . Dystonia    left side  . Glaucoma   . Hx of seasonal allergies   . Hypercholesterolemia   . Hypertension     Past Surgical History:  Procedure Laterality Date  . COLONOSCOPY    . COLONOSCOPY WITH PROPOFOL N/A 11/27/2014   Procedure: COLONOSCOPY WITH PROPOFOL;  Surgeon: Garlan Fair, MD;  Location: [facility];  Service: Endoscopy;  Laterality: N/A;  . Left wrist tendon repair Left 2000   [place]  . POLYPECTOMY    . VASECTOMY      There were no vitals filed for this visit.   Subjective Assessment - 09/07/20 0936    Subjective  "I still really think that would be a good idea" (talking about splinting/casting for LUE)    Pertinent History Arthritis, neuropathy, gait/balance deficits, Type 2 DM, glaucoma, lupus, and HLD and HTN    Special Tests AROM/PROM    Patient Stated Goals Improve balance and mobility/functional use of LUE    Currently in Pain? No/denies            [facility] OT Assessment - 09/07/20 0946      AROM   Left Shoulder Flexion 98 Degrees    Left Shoulder ABduction 44 Degrees    Left Elbow Flexion 74    Left Elbow Extension 55      PROM   PROM Assessment Site Shoulder;Elbow;Wrist     Right/Left Shoulder Left    Left Shoulder Extension 28 Degrees    Left Shoulder Flexion 112 Degrees    Left Shoulder ABduction 82 Degrees    Left Shoulder Internal Rotation 75 Degrees    Left Shoulder External Rotation 42 Degrees    Right/Left Elbow Left    Left Elbow Flexion 143    Left Elbow Extension 30    Right/Left Wrist Left    Left Wrist Extension 12 Degrees    Left Wrist Flexion 23 Degrees            OT Treatments/Exercises (OP) - 09/07/20 1527      ADLs   ADL Comments OT reviewed and discussed compensatory strategies for cutting food, utilizing functional pinch pattern to manipulate clothing, and opening jars    Increased Safety Strategies Education provided on in-home fall prevention      Manual Therapy   Passive ROM OT-facilitated PROM of L shoulder, elbow, and hand, holding stretch at end range to complete objective measurement for re-assessment            OT Education - 09/07/20 1509    Education Details Education provided on fall prevention and potiential  in-home environmental adaptations to decrease fall risk    Person(s) Educated Patient    Methods Explanation;Handout    Comprehension Verbalized understanding            OT Short Term Goals - 09/07/20 1004      OT SHORT TERM GOAL #1   Title Pt will verbalize understanding of at least 2 safety/fall prevention strategies to improve balance and safety with in-home functional mobility    Baseline Recurrent falls; decreased knowledge of fall prevention/safety strategies    Time 4    Period Weeks    Status Achieved   09/07/20 - verbalized 3 fall prevention strategies pt has implemented at home; handout provided   Target Date --      OT SHORT TERM GOAL #2   Title Pt will be able to complete initial HEP designed for LUE ROM independently    Baseline No current HEP    Time 4    Status Achieved   08/10/20 - Pt reports compliance at home w/ initial HEP designed for LUE     OT SHORT TERM GOAL #3   Title Pt  will be independent with wear and care of orthosis for positioning and management of deformity    Baseline No orthosis at this time    Time 2    Period Weeks    Status On-going    Target Date 10/05/20      OT SHORT TERM GOAL #4   Title Pt will participate in dynamic balance activities w/ Mod I to facilitate improved safety during functional mobility and decrease fall risk    Baseline Increased fall risk due to decreased balance    Time 2    Period Weeks    Status New    Target Date 10/05/20            OT Long Term Goals - 09/07/20 0940      OT LONG TERM GOAL #1   Title Pt will be independent with HEP designed to improve balance and functional use of LUE    Baseline Current HEP for LUE ROM    Time 8    Period Weeks    Status Revised    Target Date 10/19/20      OT LONG TERM GOAL #2   Title Pt will be able to manipulate clothing fasteners, using AE prn, with Mod A    Baseline Dependent with fasteners    Time 8    Period Weeks    Status Revised    Target Date 10/19/20      OT LONG TERM GOAL #3   Title Pt will be Mod I with cutting food at least 50% of the time    Baseline Unable to cut tougher foods at this time    Time 8    Period Weeks    Status Achieved   09/07/20 - Mod I w/ cutting food using adaptive cutting board 100% of the time     OT LONG TERM GOAL #4   Title Pt will demonstrate understanding of compensatory strategies, including use of assistive devices, 100% of the time to improve participation in ADLs    Baseline No consistent compensatory strategies/AE for more challenging ADL tasks    Time 8    Period Weeks    Status Achieved   09/07/20 - independent w/ compensatory strategies introduced for various ADL/IADL tasks           Plan - 09/07/20 0944    Clinical  Impression Statement OT discussed all current goals w/ pt and determined additional pt-centered goals to include in updated POC. Objective measurements taken of LUE AROM and PROM. OT also provided  education on fall prevention strategies and related environmental adaptations to improve safety w/ functional mobility. Pt also reports great carryover of various ADL/IADL compensatory strategies discussed since initiation of OT. Pt continues to experience limiting dystonia of LUE, but reports HEP designed for increasing ROM has been beneficial. Due to pt's upcoming travel, pt has requested to resume OT services 09/21/20.    OT Occupational Profile and History Detailed Assessment- Review of Records and additional review of physical, cognitive, psychosocial history related to current functional performance    Occupational performance deficits (Please refer to evaluation for details): ADL's;IADL's;Leisure    Body Structure / Function / Physical Skills ADL;ROM;UE functional use;Balance;Decreased knowledge of use of DME;FMC;GMC;Gait;Pain;Strength;Coordination;IADL;Tone    Rehab Potential Good    Clinical Decision Making Several treatment options, min-mod task modification necessary    Comorbidities Affecting Occupational Performance: Presence of comorbidities impacting occupational performance    Modification or Assistance to Complete Evaluation  Min-Moderate modification of tasks or assist with assess necessary to complete eval    OT Frequency 1x / week    OT Duration 4 weeks    OT Treatment/Interventions Self-care/ADL training;Electrical Stimulation;Therapeutic exercise;Moist Heat;Neuromuscular education;Patient/family education;Splinting;Energy conservation;Fluidtherapy;Therapeutic activities;Balance training;DME and/or AE instruction;Manual Therapy;Passive range of motion;Visual/perceptual remediation/compensation;Aquatic Therapy;Functional Mobility Training    Plan Balance activities    Consulted and Agree with Plan of Care Patient           Patient will benefit from skilled therapeutic intervention in order to improve the following deficits and impairments:   Body Structure / Function / Physical  Skills: ADL,ROM,UE functional use,Balance,Decreased knowledge of use of DME,FMC,GMC,Gait,Pain,Strength,Coordination,IADL,Tone       Visit Diagnosis: Stiffness of left hand, not elsewhere classified  Stiffness of left elbow, not elsewhere classified  Other abnormal involuntary movements  Other lack of coordination  History of falling    Problem List Patient Active Problem List   Diagnosis Date Noted  . Acute pain of left shoulder 10/13/2017  . Essential hypertension 10/12/2017  . Elevated cholesterol with elevated triglycerides 10/12/2017  . Controlled type 2 diabetes mellitus without complication, without long-term current use of insulin ([place]) 10/12/2017  . Stenosis of left carotid artery 10/12/2017  . Left arm pain 10/12/2017  . Glaucoma 10/12/2017  . 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 Alcohol use 10/12/2017  . Morbid obesity ([place]) 10/12/2017     Kathrine Cords, OTR/L, [place] 09/07/2020, 4:00 PM  [place]. [place], [place], [address] Phone: [phone]   Fax:  [phone]  Name: Nathaniel Proctor MRN: [id number] Date of Birth: Feb 27, 1956

## 2020-09-21 ENCOUNTER — Encounter: Payer: Self-pay | Admitting: Occupational Therapy

## 2020-09-21 ENCOUNTER — Ambulatory Visit: Payer: Medicare Other | Admitting: Occupational Therapy

## 2020-09-21 ENCOUNTER — Other Ambulatory Visit: Payer: Self-pay

## 2020-09-21 DIAGNOSIS — M25622 Stiffness of left elbow, not elsewhere classified: Secondary | ICD-10-CM

## 2020-09-21 DIAGNOSIS — M25642 Stiffness of left hand, not elsewhere classified: Secondary | ICD-10-CM

## 2020-09-21 DIAGNOSIS — Z9181 History of falling: Secondary | ICD-10-CM

## 2020-09-21 DIAGNOSIS — R278 Other lack of coordination: Secondary | ICD-10-CM

## 2020-09-21 DIAGNOSIS — R258 Other abnormal involuntary movements: Secondary | ICD-10-CM

## 2020-09-21 NOTE — Patient Instructions (Signed)
Knee High     Holding on to a stable object, raise knee to no higher than hip level, then lower back down. Repeat with other leg. Repeat 10 times on each side. Do ~2 sessions per day.    Side Step     Holding on to a stable object the whole exercise, step sideways along the length of a short countertop/table. Repeat in opposite direction.  Complete 5 reps along length of surface per set, ~2 sets per day    FUNCTIONAL MOBILITY: Walk Backward     Holding on to a stable object the whole exercise, walk backward. Do not drag feet. Complete 5 reps along length of surface per set, ~2 sets per day

## 2020-09-21 NOTE — Therapy (Signed)
[place]. [place], [place], [address] Phone: [phone]   Fax:  [phone]  Occupational Therapy Treatment  Patient Details  Name: Nathaniel Proctor MRN: [id number] Date of Birth: [birth year] Referring Provider (OT): Laurena Slimmer, MD   Encounter Date: 09/21/2020   OT End of Session - 09/21/20 0924    Visit Number 9    Number of Visits 13    Date for OT Re-Evaluation 10/19/20    Authorization Type [employer]    OT Start Time [phone]    OT Stop Time 1010    OT Time Calculation (min) 45 min    Activity Tolerance Patient tolerated treatment well    Behavior During Therapy [facility] for tasks assessed/performed           Past Medical History:  Diagnosis Date  . Allergy   . Depression   . Dystonia    left side  . Glaucoma   . Hx of seasonal allergies   . Hypercholesterolemia   . Hypertension     Past Surgical History:  Procedure Laterality Date  . COLONOSCOPY    . COLONOSCOPY WITH PROPOFOL N/A 11/27/2014   Procedure: COLONOSCOPY WITH PROPOFOL;  Surgeon: Garlan Fair, MD;  Location: [facility];  Service: Endoscopy;  Laterality: N/A;  . Left wrist tendon repair Left 2000   [place]  . POLYPECTOMY    . VASECTOMY      There were no vitals filed for this visit.   Subjective Assessment - 09/21/20 0931    Subjective  "I have a lot of appointments coming up in May, so hopefully that'll at least rule some things out"    Pertinent History Arthritis, neuropathy, gait/balance deficits, Type 2 DM, glaucoma, lupus, and HLD and HTN    Special Tests AROM/PROM    Patient Stated Goals Improve balance and mobility/functional use of LUE    Currently in Pain? No/denies            OT Treatments/Exercises (OP) - 09/21/20 1000      ADLs   Functional Mobility Pt completed dynamic standing balance activity involving reaching outside BOS to tap various targets on windows w/ weighted ball at standby  assist; no LOB during activity. Pt also completed standing and gait balance activities to improve safety w/ functional mobility; see 'Balance Exercises' below for detail    Leisure OT facilitated simulation of picking up sticks in the yard using reacher outside clinic on uneven surface; problem-solved various safety strategies and alternate methods to prevent falls and increase efficiency during activity including using a bag and determining safest method to store reacher to free up RUE when ambulating            Balance Exercises - 09/21/20 1000      Balance Exercises: Standing   Retro Gait Upper extremity support;5 reps   5 reps along length of countertop holding on to countertop w/ RUE   Sidestepping Upper extremity support;5 reps   5 reps along length of countertop holding on to countertop w/ RUE   Marching Solid surface;10 reps   10 reps each side holding on to countertop surface w/ RUE           OT Short Term Goals - 09/21/20 1150      OT SHORT TERM GOAL #1   Title Pt will verbalize understanding of at least 2 safety/fall prevention strategies to improve balance and safety with in-home functional mobility    Baseline  Recurrent falls; decreased knowledge of fall prevention/safety strategies    Time 4    Period Weeks    Status Achieved   09/07/20 - verbalized 3 fall prevention strategies pt has implemented at home; handout provided     OT SHORT TERM GOAL #2   Title Pt will be able to complete initial HEP designed for LUE ROM independently    Baseline No current HEP    Time 4    Status Achieved   08/10/20 - Pt reports compliance at home w/ initial HEP designed for LUE     OT SHORT TERM GOAL #3   Title Pt will be independent with wear and care of orthosis for positioning and management of deformity    Baseline No orthosis at this time    Time 2    Period Weeks    Status On-going    Target Date 10/05/20      OT SHORT TERM GOAL #4   Title Pt will participate in dynamic balance  activities w/ Mod I to facilitate improved safety during functional mobility and decrease fall risk    Baseline Increased fall risk due to decreased balance    Time 2    Period Weeks    Status On-going    Target Date 10/05/20            OT Long Term Goals - 09/21/20 1150      OT LONG TERM GOAL #1   Title Pt will be independent with HEP designed to improve balance and functional use of LUE    Baseline Current HEP for LUE ROM    Time 8    Period Weeks    Status On-going      OT LONG TERM GOAL #2   Title Pt will be able to manipulate clothing fasteners, using AE prn, with Mod A    Baseline Dependent with fasteners    Time 8    Period Weeks    Status On-going      OT LONG TERM GOAL #3   Title Pt will be Mod I with cutting food at least 50% of the time    Baseline Unable to cut tougher foods at this time    Time 8    Period Weeks    Status Achieved   09/07/20 - Mod I w/ cutting food using adaptive cutting board 100% of the time     OT LONG TERM GOAL #4   Title Pt will demonstrate understanding of compensatory strategies, including use of assistive devices, 100% of the time to improve participation in ADLs    Baseline No consistent compensatory strategies/AE for more challenging ADL tasks    Time 8    Period Weeks    Status Achieved   09/07/20 - independent w/ compensatory strategies introduced for various ADL/IADL tasks           Plan - 09/21/20 0928    Clinical Impression Statement Session focused on dynamic standing balance and functional mobility. Pt did not experience difficulty or LOB w/ dynamic standing activity and OT introduced balance activities for pt to complete at home; OT reiterated importance of always holding on to a stable object (e.g., countertop, large table, back of sofa) during each exercise and provided a handout w/ additional instruction written on front of handout. OT also problem-solved w/ pt to determine safest method of picking up sticks in the yard to  prevent falls and discussed benefit of using reacher.    OT Occupational  Profile and History Detailed Assessment- Review of Records and additional review of physical, cognitive, psychosocial history related to current functional performance    Occupational performance deficits (Please refer to evaluation for details): ADL's;IADL's;Leisure    Body Structure / Function / Physical Skills ADL;ROM;UE functional use;Balance;Decreased knowledge of use of DME;FMC;GMC;Gait;Pain;Strength;Coordination;IADL;Tone    Rehab Potential Good    Clinical Decision Making Several treatment options, min-mod task modification necessary    Comorbidities Affecting Occupational Performance: Presence of comorbidities impacting occupational performance    Modification or Assistance to Complete Evaluation  Min-Moderate modification of tasks or assist with assess necessary to complete eval    OT Frequency 1x / week    OT Duration 4 weeks    OT Treatment/Interventions Self-care/ADL training;Electrical Stimulation;Therapeutic exercise;Moist Heat;Neuromuscular education;Patient/family education;Splinting;Energy conservation;Fluidtherapy;Therapeutic activities;Balance training;DME and/or AE instruction;Manual Therapy;Passive range of motion;Visual/perceptual remediation/compensation;Aquatic Therapy;Functional Mobility Training    Plan Getting up after a fall    Consulted and Agree with Plan of Care Patient           Patient will benefit from skilled therapeutic intervention in order to improve the following deficits and impairments:   Body Structure / Function / Physical Skills: ADL,ROM,UE functional use,Balance,Decreased knowledge of use of DME,FMC,GMC,Gait,Pain,Strength,Coordination,IADL,Tone       Visit Diagnosis: History of falling  Stiffness of left hand, not elsewhere classified  Stiffness of left elbow, not elsewhere classified  Other abnormal involuntary movements  Other lack of coordination    Problem  List Patient Active Problem List   Diagnosis Date Noted  . Acute pain of left shoulder 10/13/2017  . Essential hypertension 10/12/2017  . Elevated cholesterol with elevated triglycerides 10/12/2017  . Controlled type 2 diabetes mellitus without complication, without long-term current use of insulin ([place]) 10/12/2017  . Stenosis of left carotid artery 10/12/2017  . Left arm pain 10/12/2017  . Glaucoma 10/12/2017  . 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 Alcohol use 10/12/2017  . Morbid obesity ([place]) 10/12/2017     Kathrine Cords, OTR/L, [place] 09/21/2020, 12:05 PM  [place]. [place], [place], [address] Phone: [phone]   Fax:  [phone]  Name: Nathaniel Proctor MRN: [id number] Date of Birth: 1956/02/22

## 2020-09-28 ENCOUNTER — Other Ambulatory Visit: Payer: Self-pay

## 2020-09-28 ENCOUNTER — Ambulatory Visit: Payer: Medicare Other | Admitting: Occupational Therapy

## 2020-09-28 DIAGNOSIS — R258 Other abnormal involuntary movements: Secondary | ICD-10-CM

## 2020-09-28 DIAGNOSIS — M25642 Stiffness of left hand, not elsewhere classified: Secondary | ICD-10-CM | POA: Diagnosis not present

## 2020-09-28 DIAGNOSIS — R278 Other lack of coordination: Secondary | ICD-10-CM

## 2020-09-28 DIAGNOSIS — Z9181 History of falling: Secondary | ICD-10-CM

## 2020-09-28 DIAGNOSIS — M25622 Stiffness of left elbow, not elsewhere classified: Secondary | ICD-10-CM

## 2020-09-28 NOTE — Patient Instructions (Signed)
Access Code: [email or web address] URL: [email or web address] Date: 09/28/2020 Prepared by: Merleen Nicely, OTR/L  Exercises Supine Shoulder Flexion AAROM with Hands Clasped - 2-3 x daily - 2 sets - 10 reps Elbow Extension PROM - 2-3 x daily - 2 sets - 10 reps Elbow Flexion PROM - 2-3 x daily - 2 sets - 10 reps Hand PROM Finger Extension - 2-3 x daily - 2 sets - 10 reps March in Place - 2 x daily - 2 sets - 10 reps Side Stepping with Unilateral Counter Support - 2 x daily - 5 sets Backward Walking with Counter Support - 2 x daily - 5 sets

## 2020-09-29 NOTE — Therapy (Signed)
[facility] [place], [place], [address] Phone: [phone]   Fax:  [phone]  Occupational Therapy Treatment & Discharge Summary  Patient Details  Name: Nathaniel Proctor MRN: [id number] Date of Birth: [birth year] Referring Provider (OT): Laurena Slimmer, MD   Encounter Date: 09/28/2020   Occupational Therapy Progress Note  Dates of Reporting Period: 07/13/20 to 09/28/20  See note below for objective data and assessment of progress toward goals  Goal Update: Pt has met 3/4 STGs and 3/4 LTGs  Plan: D/C    OT End of Session - 09/28/20 0928    Visit Number 10    Number of Visits 13    Date for OT Re-Evaluation 10/19/20    Authorization Type [employer]    OT Start Time [phone]    OT Stop Time 1010    OT Time Calculation (min) 45 min    Activity Tolerance Patient tolerated treatment well    Behavior During Therapy WFL for tasks assessed/performed           Past Medical History:  Diagnosis Date  . Allergy   . Depression   . Dystonia    left side  . Glaucoma   . Hx of seasonal allergies   . Hypercholesterolemia   . Hypertension     Past Surgical History:  Procedure Laterality Date  . COLONOSCOPY    . COLONOSCOPY WITH PROPOFOL N/A 11/27/2014   Procedure: COLONOSCOPY WITH PROPOFOL;  Surgeon: Garlan Fair, MD;  Location: [facility];  Service: Endoscopy;  Laterality: N/A;  . Left wrist tendon repair Left 2000   [place]  . POLYPECTOMY    . VASECTOMY      There were no vitals filed for this visit.   Subjective Assessment - 09/28/20 0926    Subjective  "I got up early so I could take my pills; they take a while to kick in and once they do it really helps"    Pertinent History Arthritis, neuropathy, gait/balance deficits, Type 2 DM, glaucoma, lupus, and HLD and HTN    Special Tests AROM/PROM    Patient Stated Goals Improve balance and mobility/functional use of LUE    Currently in  Pain? No/denies              OT Treatments/Exercises (OP) - 09/28/20 1015      ADLs   Compensatory Strategies OT reviewed compensatory strategies for manipulation of clothing fasteners including using lateral pinch as a stabilizer when able; pt reports he is satisfied currently having his wife help him with fasteners at this time    General Comments OT reviewed all goals with pt      Elbow Exercises   Other elbow exercises OT provided exercises for gentle stretching of the L elbow flexion/extension to include in HEP; pt returned demonstration of exercise      Hand Exercises   Other Hand Exercises OT reviewed current exercises for gentle stretching of the L wrist and digits and provided additional exercise to include in HEP in preparation for d/c; pt returned demonstration of each      Functional Reaching Activities   Mid Level Pt completed dynamic standing activity involving functional reaching to mid and overhead levels to retrieve/place small weighted ball; pt able to reach outside BOS w/ RUE while standing without LOB             OT Short Term Goals - 09/28/20 1002      OT SHORT  TERM GOAL #1   Title Pt will verbalize understanding of at least 2 safety/fall prevention strategies to improve balance and safety with in-home functional mobility    Baseline Recurrent falls; decreased knowledge of fall prevention/safety strategies    Time 4    Period Weeks    Status Achieved   09/07/20 - verbalized 3 fall prevention strategies pt has implemented at home; handout provided     OT SHORT TERM GOAL #2   Title Pt will be able to complete initial HEP designed for LUE ROM independently    Baseline No current HEP    Time 4    Status Achieved   08/10/20 - Pt reports compliance at home w/ initial HEP designed for LUE     OT SHORT TERM GOAL #3   Title Pt will be independent with wear and care of orthosis for positioning and management of deformity    Baseline No orthosis at this time     Time 2    Period Weeks    Status Not Met    Target Date 10/05/20      OT SHORT TERM GOAL #4   Title Pt will participate in dynamic balance activities w/ Mod I to facilitate improved safety during functional mobility and decrease fall risk    Baseline Increased fall risk due to decreased balance    Time 2    Period Weeks    Status Achieved   09/28/20 - demo'd adequate functional dynamic standing balance during overhead and lateral reaching activity   Target Date --             OT Long Term Goals - 09/28/20 0958      OT LONG TERM GOAL #1   Title Pt will be independent with HEP designed to improve balance and functional use of LUE    Baseline Current HEP for LUE ROM    Time 8    Period Weeks    Status Achieved   09/28/20 - independent w/ full HEP at this time     OT LONG TERM GOAL #2   Title Pt will be able to manipulate clothing fasteners, using AE prn, with Mod A    Baseline Dependent with fasteners    Time 8    Period Weeks    Status Not Met   09/28/20 - per states he is currently satisfied w/ receiving assist for manipulating fasteners     OT LONG TERM GOAL #3   Title Pt will be Mod I with cutting food at least 50% of the time    Baseline Unable to cut tougher foods at this time    Time 8    Period Weeks    Status Achieved   09/07/20 - Mod I w/ cutting food using adaptive cutting board 100% of the time     OT LONG TERM GOAL #4   Title Pt will demonstrate understanding of compensatory strategies, including use of assistive devices, 100% of the time to improve participation in ADLs    Baseline No consistent compensatory strategies/AE for more challenging ADL tasks    Time 8    Period Weeks    Status Achieved   09/07/20 - independent w/ compensatory strategies introduced for various ADL/IADL tasks            Plan - 09/28/20 1015    Clinical Impression Statement Pt states he has multiple doctor's appointments coming up in the month of May and is hoping he will get some  answers regarding the etiology of LUE dystonia. Due to this, pt is requesting a hiatus from therapy until after physician visits. Pt has met 3/4 STGs and 3/4 LTGs, with the remaining LTG being d/c, and is appropriate for discharge to HEP at this time. OT provided updated HEP for stretching and functional use of LUE as well as continuing to facilitate improved balance. Pt requested to be scheduled the first week of July and stated he will request a new referral from his physician; pt will be re-evaluated at that point to determine if skilled occupational therapy services are warranted.    OT Occupational Profile and History Detailed Assessment- Review of Records and additional review of physical, cognitive, psychosocial history related to current functional performance    Occupational performance deficits (Please refer to evaluation for details): ADL's;IADL's;Leisure    Body Structure / Function / Physical Skills ADL;ROM;UE functional use;Balance;Decreased knowledge of use of DME;FMC;GMC;Gait;Pain;Strength;Coordination;IADL;Tone    Rehab Potential Good    Clinical Decision Making Several treatment options, min-mod task modification necessary    Comorbidities Affecting Occupational Performance: Presence of comorbidities impacting occupational performance    Modification or Assistance to Complete Evaluation  Min-Moderate modification of tasks or assist with assess necessary to complete eval    OT Frequency 1x / week    OT Duration 4 weeks    OT Treatment/Interventions Self-care/ADL training;Electrical Stimulation;Therapeutic exercise;Moist Heat;Neuromuscular education;Patient/family education;Splinting;Energy conservation;Fluidtherapy;Therapeutic activities;Balance training;DME and/or AE instruction;Manual Therapy;Passive range of motion;Visual/perceptual remediation/compensation;Aquatic Therapy;Functional Mobility Training    Plan D/C    Consulted and Agree with Plan of Care Patient           Patient  will benefit from skilled therapeutic intervention in order to improve the following deficits and impairments:   Body Structure / Function / Physical Skills: ADL,ROM,UE functional use,Balance,Decreased knowledge of use of DME,FMC,GMC,Gait,Pain,Strength,Coordination,IADL,Tone       OCCUPATIONAL THERAPY DISCHARGE SUMMARY  Visits from Start of Care: 10  Current functional level related to goals / functional outcomes: Pt is independent w/ full HEP, Mod I using AE to cut food, Mod I w/ dynamic standing balance during functional reaching, and independently demonstrate or verbalize understanding of learned compensatory and fall prevention strategies.    Remaining deficits: LUE dystonia and decreased control and coordination; decreased balance w/ ambulation   Education / Equipment: Compensatory strategies, adaptive cutting board, fall prevention, reacher  Plan: Patient agrees to discharge.  Patient goals were partially met.  Patient is being discharged due to the patient's request.  ?????      Visit Diagnosis: Stiffness of left elbow, not elsewhere classified  Stiffness of left hand, not elsewhere classified  Other abnormal involuntary movements  Other lack of coordination  History of falling    Problem List Patient Active Problem List   Diagnosis Date Noted  . Acute pain of left shoulder 10/13/2017  . Essential hypertension 10/12/2017  . Elevated cholesterol with elevated triglycerides 10/12/2017  . Controlled type 2 diabetes mellitus without complication, without long-term current use of insulin ([place]) 10/12/2017  . Stenosis of left carotid artery 10/12/2017  . Left arm pain 10/12/2017  . Glaucoma 10/12/2017  . 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 Alcohol use 10/12/2017  . Morbid obesity ([place]) 10/12/2017    Kathrine Cords, OTR/L, [place] 09/28/2020, 10:15 AM  [facility]. [place], [place], [address] Phone:  [phone]   Fax:  [phone]  Name: Nathaniel Proctor MRN: [id number] Date of Birth: Aug 31, 1955

## 2020-11-30 ENCOUNTER — Encounter: Payer: Self-pay | Admitting: Occupational Therapy

## 2020-12-07 ENCOUNTER — Encounter: Payer: Self-pay | Admitting: Occupational Therapy

## 2020-12-13 ENCOUNTER — Ambulatory Visit: Payer: Medicare Other | Attending: Psychiatry | Admitting: Occupational Therapy

## 2020-12-13 ENCOUNTER — Other Ambulatory Visit: Payer: Self-pay

## 2020-12-13 DIAGNOSIS — R278 Other lack of coordination: Secondary | ICD-10-CM | POA: Diagnosis present

## 2020-12-13 DIAGNOSIS — Z9181 History of falling: Secondary | ICD-10-CM | POA: Diagnosis present

## 2020-12-13 DIAGNOSIS — M25622 Stiffness of left elbow, not elsewhere classified: Secondary | ICD-10-CM | POA: Diagnosis present

## 2020-12-13 DIAGNOSIS — M6281 Muscle weakness (generalized): Secondary | ICD-10-CM | POA: Insufficient documentation

## 2020-12-13 DIAGNOSIS — M25642 Stiffness of left hand, not elsewhere classified: Secondary | ICD-10-CM | POA: Diagnosis present

## 2020-12-13 DIAGNOSIS — R258 Other abnormal involuntary movements: Secondary | ICD-10-CM | POA: Insufficient documentation

## 2020-12-14 NOTE — Therapy (Signed)
[facility]. [place], [place], [address] Phone: [phone]   Fax:  [phone]  Occupational Therapy Evaluation  Patient Details  Name: Nathaniel Proctor MRN: [id number] Date of Birth: Oct 31, 1955 Referring Provider (OT): Laurena Slimmer, MD   Encounter Date: 12/13/2020   OT End of Session - 12/13/20 1811     Visit Number 1    Number of Visits 9    Date for OT Re-Evaluation 03/13/21    Authorization Type [employer] (primary); [employer] (secondary)    Authorization Time Period VL: [place]    Progress Note Due on Visit 10    OT Start Time 1010    OT Stop Time 1104    OT Time Calculation (min) 54 min    Behavior During Therapy [facility] for tasks assessed/performed             Past Medical History:  Diagnosis Date   Allergy    Depression    Dystonia    left side   Glaucoma    Hx of seasonal allergies    Hypercholesterolemia    Hypertension     Past Surgical History:  Procedure Laterality Date   COLONOSCOPY     COLONOSCOPY WITH PROPOFOL N/A 11/27/2014   Procedure: COLONOSCOPY WITH PROPOFOL;  Surgeon: Garlan Fair, MD;  Location: [facility];  Service: Endoscopy;  Laterality: N/A;   Left wrist tendon repair Left 2000   [place]      There were no vitals filed for this visit.   Subjective Assessment - 12/13/20 1018     Subjective  Pt reports his neurologist recently retired and he is seeking out a new neurology team. Pt states he is hopeful that receiving a 2nd opinion regarding his symptoms may lead to new or helpful information.   Pertinent History Non-small cell lung cancer of Lt lung (active immunotherapy; dx 04/16/20), LUE focal dystonia, DMT2 w/ peripheral vascular disease, arthritis, carotid stenosis, glaucoma, HLD and HTN    Limitations Gait/balance deficits, decreased functional use of LUE    Patient Stated Goals Improve balance and safety w/ ambulation as well as GMC of  LUE    Currently in Pain? No/denies             [facility] OT Assessment - 12/13/20 1805       Assessment   Medical Diagnosis Dystonia    Referring Provider (OT) Thom Chimes, MD    Hand Dominance Right    Prior Therapy Yes      Precautions   Precautions Fall      Balance Screen   Has the patient fallen in the past 6 months Yes    Has the patient had a decrease in activity level because of a fear of falling?  Yes    Is the patient reluctant to leave their home because of a fear of falling?  No      Home  Environment   Type of Home House    Home Access Stairs    Home Layout Two level   Bed/bath upstairs; reports unsteadiness on stairs, requiring accessible rails or assist   Bathroom Shower/Tub Walk-in Shower    Adaptive equipment Long-handled sponge;Other (comment)   Adaptive cutting board, rocker knife   [place] - single point;Shower seat    Lives With Spouse      Prior Function   Level of Independence Independent    Vocation Retired  Leisure Travel, golf      ADL   Eating/Feeding Modified independent   Use of adaptive cutting board and rocker knife   Grooming Modified independent    Upper Body Dressing Needs assist for fasteners;Increased time    Lower Body Dressing Needs assist for fasteners;Increased time    Toileting - Clothing Manipulation Modified independent    Toileting -  Field seismologist seat with back;Walk in shower;Increased time;Grab bars    ADL comments Mod I w/ pullover shirts and pull-on pants; requires Mod A from his wife to don/doff non-stretch pants and manipulate clothing fasteners      IADL   Light Housekeeping Maintains house alone or with occasional assistance   Doesn't carry items up/down staris due to risk of falls/decreased balance   Meal Prep Able to complete simple warm meal prep;Able to complete simple cold meal and snack prep    Community Mobility Drives own vehicle   Short distances    Medication Management Is responsible for taking medication in correct dosages at correct time   Leaves medication bottles open due to difficulty opening/closing caps     Mobility   Mobility Status History of falls    Mobility Status Comments Frequently uses single-point cane for functional mobility      Vision - History   Baseline Vision Wears glasses all the time   Glasses for distance and reading   Visual History Glaucoma    Additional Comments Occasionally closes R eye seemingly when attempting to focus his vision; reports he is unaware he is doing this      Vision Assessment   Eye Alignment Impaired (comment)   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   Vision Assessment Vision not tested   To be further assessed in functional context     Activity Tolerance   Activity Tolerance Comments Reports decreased generalized endurance recently      Cognition   Overall Cognitive Status Within Functional Limits for tasks assessed      Observation/Other Assessments   Observations LUE at rest: 79* elbow flexion, 15* of wrist flexion, 73*, 77*, 81*, 84* of index, long, ring, and little finger MCP flexion respectively, IPs extended w/ little finger in 51* of PIP flexion, and thumb in 28* of MCP flexion      Sensation   Light Touch Appears Intact   No deficits with sensation, per pt report   Hot/Cold Appears Intact      Coordination   Gross Motor Movements are Fluid and Coordinated No   Decreased speed of movement   Fine Motor Movements are Fluid and Coordinated Yes    Coordination and Movement Description Movements appear fluid and coordinated in RUE; Deficits with LUE      Tone   Assessment Location Left Upper Extremity    LUE Tone    LUE Tone Hypertonic;Modified Ashworth   LUE Tone    Modified Ashworth Scale for Grading Hypertonia LUE Considerable increase in muscle tone, passive movement difficult      Palpation   Palpation comment No tenderness to palpation      AROM    Overall AROM  Deficits    AROM Assessment Site Shoulder;Elbow    Right/Left Shoulder Left    Left Shoulder Extension 26 Degrees    Left Shoulder Flexion 41 Degrees    Left Shoulder ABduction --   Unable to achieve abduction at this time   Left Elbow Flexion 106  Left Elbow Extension 61   61* of flexion achieved when attempting extension; 18 degrees of extension from elbow position at rest   Left Wrist Flexion 15 Degrees   15* of wrist flexion at rest; no active flexion/ext. Pt reports hx of injuries to wrist that have decreased ROM significantly     PROM   Overall PROM  Deficits    Overall PROM Comments Able to achieve some PROM of L elbow flex/ext and MCPJ/IPJ extension of digits; no wrist PROM      Hand Function   Right Hand Lateral Pinch 16 lbs    Left Hand Lateral Pinch 7 lbs   Difficulty w/ releasing pinch gauge            OT Education - 12/13/20 1753     Education Details Due to pt's conern about falling and that his cane is not sturdy enough, OT introduced hemi-walker (also trialed quad cane w/ pt preferring hemi-walker). Discussed appropriate height and safety considerations and pt verbalized understanding; handout w/ options for self-purchase administered. OT also discussed potential benefit of aquatic OT and pt was agreeable.    Person(s) Educated Patient    Methods Explanation;Handout    Comprehension Verbalized understanding             OT Short Term Goals - 12/13/20 1234       OT SHORT TERM GOAL #1   Title Pt will improve Lt shoulder flexion (or scaption) by at least 15* to improve participation in dressing    Baseline 41* of Lt shoulder flexion    Time 4    Period Weeks    Status New    Target Date 01/10/21      OT SHORT TERM GOAL #2   Title Pt will increase Lt elbow extension by at least 10* to progress toward pt being able to put his hand in his pants pocket    Baseline 61* of flexion when attempting elbow extension    Time 4    Period Weeks     Status New      OT SHORT TERM GOAL #3   Title Pt will demonstrate ability to actively achieve AROM of composite Lt finger extension to facilitate improvement in functional grasp and release during bilateral tasks    Baseline Unable to achieve AROM of composite finger extension    Time 4    Period Weeks    Status New             OT Long Term Goals - 12/13/20 1235       OT LONG TERM GOAL #1   Title Pt will demonstrate understanding of AE/devices (hemi-walker, bottle/jar opener, dressing equipment, etc) prn to improve participation and independence in desired daily activities    Baseline May benefit from add'l assistive devices/equipment    Time 8    Period Weeks    Status New    Target Date 02/07/21      OT LONG TERM GOAL #2   Title Pt will improve functional mobility for safety when travelling as evidenced by ambulating 20' w/out LOB using AE prn    Baseline Evidence of imbalance when ambulating into OT session; reports frequent instability at home    Time 8    Period Weeks    Status New      OT LONG TERM GOAL #3   Title Pt will be able to achieve self-PROM/AAROM of BUE overhead reach to at least 140* to improve ease of maneuvering  through airport security when travelling    Baseline Significantly decreased overhead ROM    Time 8    Period Weeks    Status New      OT LONG TERM GOAL #4   Title Pt will be independent with HEP (including wear and care of orthosis if needed) designed to facilitate balance, improved positioning of LUE, and prevent further joint contracture    Time 8    Period Weeks    Status New             Plan - 12/13/20 1138     Clinical Impression Statement Pt is a 65 y.o. male who present to OP OT due to primary concerns related to LUE focal dystonia and decreased balance and endurance. Pt was previously seen at this clinic for OP OT in Feb-April of this year before taking extended time away from therapy due to numerous MD appointments. Upon  returning for session today, pt demo's decreased balance and safety w/ ambulation, as well as increased loss of movement in the LUE, particularly shoulder and elbow. Pt will benefit from skilled occupational therapy services to address ROM and GMC, compensatory strategies including AE prn, strength, endurance, functional use of LUE, and implementation of an HEP to improve participation and safety w/ ADLs and functional mobility.   OT Occupational Profile and History Detailed Assessment- Review of Records and additional review of physical, cognitive, psychosocial history related to current functional performance    Occupational performance deficits (Please refer to evaluation for details): ADL's;IADL's;Leisure;Social Participation    Body Structure / Function / Physical Skills ADL;ROM;UE functional use;Balance;Decreased knowledge of use of DME;FMC;GMC;Gait;Strength;Coordination;IADL;Tone;Mobility;Vision;Body mechanics;Dexterity;Endurance;Flexibility    Rehab Potential Fair    Clinical Decision Making Several treatment options, min-mod task modification necessary    Comorbidities Affecting Occupational Performance: Presence of comorbidities impacting occupational performance    Modification or Assistance to Complete Evaluation  Min-Moderate modification of tasks or assist with assess necessary to complete eval    OT Frequency 1x / week    OT Duration 8 weeks    OT Treatment/Interventions Self-care/ADL training;Therapeutic exercise;Neuromuscular education;Patient/family education;Splinting;Energy conservation;Therapeutic activities;Balance training;DME and/or AE instruction;Manual Therapy;Passive range of motion;Visual/perceptual remediation/compensation;Aquatic Therapy;Functional Mobility Training    Plan Initiate aquatic therapy to facilitate UE ROM and balance    Consulted and Agree with Plan of Care Patient             Patient will benefit from skilled therapeutic intervention in order to  improve the following deficits and impairments:   Body Structure / Function / Physical Skills: ADL, ROM, UE functional use, Balance, Decreased knowledge of use of DME, FMC, GMC, Gait, Strength, Coordination, IADL, Tone, Mobility, Vision, Body mechanics, Dexterity, Endurance, Flexibility   Visit Diagnosis: No diagnosis found.    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 [place], [place], [address] Phone: [phone]   Fax:  [phone]  Name: Nathaniel Proctor MRN: [id number] Date of  Birth: 22-Nov-1955

## 2020-12-17 ENCOUNTER — Encounter: Payer: Self-pay | Admitting: Occupational Therapy

## 2020-12-17 ENCOUNTER — Ambulatory Visit: Payer: Medicare Other | Admitting: Occupational Therapy

## 2020-12-17 DIAGNOSIS — R258 Other abnormal involuntary movements: Secondary | ICD-10-CM | POA: Diagnosis not present

## 2020-12-17 NOTE — Therapy (Signed)
[facility] [address] [place], [place], [address] Phone: [phone]   Fax:  [phone]  Occupational Therapy Treatment  Patient Details  Name: Agustin Swatek MRN: [id number] Date of Birth: Jan 25, 1956 Referring Provider (OT): Thom Chimes, MD   Encounter Date: 12/17/2020   OT End of Session - 12/17/20 1324     Visit Number 2    Number of Visits 9    Date for OT Re-Evaluation 03/13/21    Authorization Type U[employer] Medicare (primary); [employer] (secondary)    Authorization Time Period VL: [place]    Progress Note Due on Visit 10    OT Start Time 1130    OT Stop Time 1215    OT Time Calculation (min) 45 min    Equipment Utilized During Treatment flaotation equipment    Activity Tolerance Patient tolerated treatment well    Behavior During Therapy [facility] for tasks assessed/performed             Past Medical History:  Diagnosis Date   Allergy    Depression    Dystonia    left side   Glaucoma    Hx of seasonal allergies    Hypercholesterolemia    Hypertension     Past Surgical History:  Procedure Laterality Date   COLONOSCOPY     COLONOSCOPY WITH PROPOFOL N/A 11/27/2014   Procedure: COLONOSCOPY WITH PROPOFOL;  Surgeon: Garlan Fair, MD;  Location: [facility];  Service: Endoscopy;  Laterality: N/A;   Left wrist tendon repair Left 2000   [place]      There were no vitals filed for this visit.   Subjective Assessment - 12/17/20 1323     Subjective  I thought this could help with my balance    Pertinent History Non-small cell lung cancer of Lt lung (active immunotherapy; dx 04/16/20), LUE focal dystonia, DMT2 w/ peripheral vascular disease, arthritis, carotid stenosis, glaucoma, HLD and HTN    Limitations Gait/balance deficits, decreased functional use of LUE    Patient Stated Goals Improve balance and safety w/ ambulation as well as GMC of LUE    Currently in Pain? No/denies     Multiple Pain Sites No            Patient seen for aquatic therapy - first visit.  Patient entered the water via stairs with one railing and min assist. Patient exited the water via stairs and 2 railings with mod assist to guide LUE.  Addressed stand balance with mild turbulence.  Patient with definite increase in UE tension when up on feet.  In seated position, tension decreases to allow greater degrres of shoulder, elbow, forearm freedom of passive movement.  Educated patient to wall exercises to address single limb stance to address LLE contreol, strength.  Patient needed one UE support for wall exercises.  Also educated patient in wall squat - facing wall, UE's suported on wall out of water, squatting to increase shoulder range of motion passively.   Supine floatation to provide passive stretch and body on arm movement.   Patient may benefit from medical management if muscle tension.                      OT Education - 12/17/20 1323     Education Details Encouraged patient to seek new neurologist as his neuro MD retired.  Patient may benefit from medication management for increased muscle tension    Person(s)  Educated Patient    Methods Explanation    Comprehension Need further instruction              OT Short Term Goals - 12/17/20 1327       OT SHORT TERM GOAL #1   Title Pt will improve Lt shoulder flexion (or scaption) by at least 15* to improve participation in dressing    Baseline 41* of Lt shoulder flexion    Time 4    Period Weeks    Status On-going    Target Date 01/10/21      OT SHORT TERM GOAL #2   Title Pt will increase Lt elbow extension by at least 10* to progress toward pt being able to put his hand in his pants pocket    Baseline 61* of flexion when attempting elbow extension    Time 4    Period Weeks    Status On-going      OT SHORT TERM GOAL #3   Title Pt will demonstrate ability to actively achieve AROM of composite Lt finger  extension to facilitate improvement in functional grasp and release during bilateral tasks    Baseline Unable to achieve AROM of composite finger extension    Time 4    Period Weeks    Status On-going               OT Long Term Goals - 12/17/20 1327       OT LONG TERM GOAL #1   Title Pt will demonstrate understanding of AE/devices (hemi-walker, bottle/jar opener, dressing equipment, etc) prn to improve participation and independence in desired daily activities    Baseline May benefit from add'l assistive devices/equipment    Time 8    Period Weeks    Status On-going      OT LONG TERM GOAL #2   Title Pt will improve functional mobility for safety when travelling as evidenced by ambulating 20' w/out LOB using AE prn    Baseline Evidence of imbalance when ambulating into OT session; reports frequent instability at home    Time 8    Period Weeks    Status On-going      OT LONG TERM GOAL #3   Title Pt will be able to achieve self-PROM/AAROM of BUE overhead reach to at least 140* to improve ease of maneuvering through airport security when travelling    Baseline Significantly decreased overhead ROM    Time 8    Period Weeks    Status On-going      OT LONG TERM GOAL #4   Title Pt will be independent with HEP (including wear and care of orthosis if needed) designed to facilitate balance, improved positioning of LUE, and prevent further joint contracture    Time 8    Period Weeks    Status On-going                   Plan - 12/17/20 1324     Clinical Impression Statement Pt presents to OT aquatic therapy visit to address balance, and LUE functioning. Patient was an avid swimmer prior to start of dystonia 3 yrs ago.    OT Occupational Profile and History Detailed Assessment- Review of Records and additional review of physical, cognitive, psychosocial history related to current functional performance    Occupational performance deficits (Please refer to evaluation for  details): ADL's;IADL's;Leisure;Social Participation    Body Structure / Function / Physical Skills ADL;ROM;UE functional use;Balance;Decreased knowledge of use of DME;FMC;GMC;Gait;Strength;Coordination;IADL;Tone;Mobility;Vision;Body mechanics;Dexterity;Endurance;Flexibility  Rehab Potential Fair    Clinical Decision Making Several treatment options, min-mod task modification necessary    Comorbidities Affecting Occupational Performance: Presence of comorbidities impacting occupational performance    Modification or Assistance to Complete Evaluation  Min-Moderate modification of tasks or assist with assess necessary to complete eval    OT Frequency 1x / week    OT Duration 8 weeks    OT Treatment/Interventions Self-care/ADL training;Therapeutic exercise;Neuromuscular education;Patient/family education;Splinting;Energy conservation;Therapeutic activities;Balance training;DME and/or AE instruction;Manual Therapy;Passive range of motion;Visual/perceptual remediation/compensation;Aquatic Therapy;Functional Mobility Training    Plan Initiate aquatic therapy to facilitate UE ROM and balance    OT Home Exercise Plan Patient able to address balance while standing at waist high or above water with RUE on wall for support, or hovering above wall    Consulted and Agree with Plan of Care Patient             Patient will benefit from skilled therapeutic intervention in order to improve the following deficits and impairments:   Body Structure / Function / Physical Skills: ADL, ROM, UE functional use, Balance, Decreased knowledge of use of DME, FMC, GMC, Gait, Strength, Coordination, IADL, Tone, Mobility, Vision, Body mechanics, Dexterity, Endurance, Flexibility       Visit Diagnosis: Stiffness of left elbow, not elsewhere classified  Stiffness of left hand, not elsewhere classified  Other lack of coordination  Muscle weakness (generalized)  Other abnormal involuntary movements    Problem  List Patient Active Problem List   Diagnosis Date Noted   Acute pain of left shoulder 10/13/2017   Essential hypertension 10/12/2017   Elevated cholesterol with elevated triglycerides 10/12/2017   Controlled type 2 diabetes mellitus without complication, without long-term current use of insulin ([place]) 10/12/2017   Stenosis of left carotid artery 10/12/2017   Left arm pain 10/12/2017   Glaucoma 10/12/2017   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 [place], [place], [address] Phone: [phone]   Fax:  [phone]  Name: Garvis Downum MRN: [id number] Date of Birth: 10-20-1955

## 2020-12-31 ENCOUNTER — Encounter: Payer: Self-pay | Admitting: Occupational Therapy

## 2020-12-31 ENCOUNTER — Ambulatory Visit: Payer: Medicare Other | Attending: Psychiatry | Admitting: Occupational Therapy

## 2020-12-31 DIAGNOSIS — M25642 Stiffness of left hand, not elsewhere classified: Secondary | ICD-10-CM | POA: Diagnosis present

## 2020-12-31 DIAGNOSIS — M25622 Stiffness of left elbow, not elsewhere classified: Secondary | ICD-10-CM | POA: Diagnosis present

## 2020-12-31 DIAGNOSIS — M6281 Muscle weakness (generalized): Secondary | ICD-10-CM | POA: Insufficient documentation

## 2020-12-31 DIAGNOSIS — R2681 Unsteadiness on feet: Secondary | ICD-10-CM | POA: Diagnosis present

## 2020-12-31 DIAGNOSIS — R278 Other lack of coordination: Secondary | ICD-10-CM | POA: Diagnosis present

## 2020-12-31 NOTE — Therapy (Signed)
[facility] [address] [place], [place], [address] Phone: [phone]   Fax:  [phone]  Occupational Therapy Treatment  Patient Details  Name: Nathaniel Proctor MRN: [id number] Date of Birth: 03/29/1956 Referring Provider (OT): Thom Chimes, MD   Encounter Date: 12/31/2020   OT End of Session - 12/31/20 1311     Visit Number 3    Number of Visits 9    Date for OT Re-Evaluation 03/13/21    Authorization Type [employer] Medicare (primary); [employer] (secondary)    Authorization Time Period VL: [place]    Progress Note Due on Visit 10    OT Start Time 1120    OT Stop Time 1215    OT Time Calculation (min) 55 min    Activity Tolerance Patient tolerated treatment well    Behavior During Therapy [facility] for tasks assessed/performed             Past Medical History:  Diagnosis Date   Allergy    Depression    Dystonia    left side   Glaucoma    Hx of seasonal allergies    Hypercholesterolemia    Hypertension     Past Surgical History:  Procedure Laterality Date   COLONOSCOPY     COLONOSCOPY WITH PROPOFOL N/A 11/27/2014   Procedure: COLONOSCOPY WITH PROPOFOL;  Surgeon: Garlan Fair, MD;  Location: [facility];  Service: Endoscopy;  Laterality: N/A;   Left wrist tendon repair Left 2000   [place]      There were no vitals filed for this visit.   Subjective Assessment - 12/31/20 1310     Subjective  Patient indicated he fell on the cruise - got tangled up in his blankets when getting out of bed.  Had abrasion on face and knee    Pertinent History Non-small cell lung cancer of Lt lung (active immunotherapy; dx 04/16/20), LUE focal dystonia, DMT2 w/ peripheral vascular disease, arthritis, carotid stenosis, glaucoma, HLD and HTN    Limitations Gait/balance deficits, decreased functional use of LUE    Special Tests AROM/PROM    Patient Stated Goals Improve balance and safety w/ ambulation as  well as GMC of LUE    Currently in Pain? No/denies             Patient seen for aquatic therapy visit.  Patient entered and exited pool via stairs with supervision assist.   Session occurred in 3.5-4.5 ft of water.   Initiated session with water walking to address balance with mild water turbulence.  Worked on static balance, walking, then stopping, and directional changes.  Used buoyancy support to address stand balance - single leg stance on alternating legs - to address balance in trunk and stance leg.  Patient initially consistently falling to right and posteriorly.  Used environmental cues and visual cues as well as verbal cues to not talk and focus on muscle activation, distance from wall etc.  Patient impulsive in his movement, and responded well to these overt cues.   Supported supine to address shoulder and elbow range of motion.  Today able to achieve~ 90 degrees abduction with 75%elbow extension passively.                          OT Short Term Goals - 12/31/20 1313       OT SHORT TERM GOAL #1   Title Pt will improve Lt shoulder  flexion (or scaption) by at least 15* to improve participation in dressing    Baseline 41* of Lt shoulder flexion    Time 4    Period Weeks    Status On-going    Target Date 01/10/21      OT SHORT TERM GOAL #2   Title Pt will increase Lt elbow extension by at least 10* to progress toward pt being able to put his hand in his pants pocket    Baseline 61* of flexion when attempting elbow extension    Time 4    Period Weeks    Status On-going      OT SHORT TERM GOAL #3   Title Pt will demonstrate ability to actively achieve AROM of composite Lt finger extension to facilitate improvement in functional grasp and release during bilateral tasks    Baseline Unable to achieve AROM of composite finger extension    Time 4    Period Weeks    Status On-going               OT Long Term Goals - 12/31/20 1313       OT LONG TERM  GOAL #1   Title Pt will demonstrate understanding of AE/devices (hemi-walker, bottle/jar opener, dressing equipment, etc) prn to improve participation and independence in desired daily activities    Baseline May benefit from add'l assistive devices/equipment    Time 8    Period Weeks    Status On-going      OT LONG TERM GOAL #2   Title Pt will improve functional mobility for safety when travelling as evidenced by ambulating 20' w/out LOB using AE prn    Baseline Evidence of imbalance when ambulating into OT session; reports frequent instability at home    Time 8    Period Weeks    Status On-going      OT LONG TERM GOAL #3   Title Pt will be able to achieve self-PROM/AAROM of BUE overhead reach to at least 140* to improve ease of maneuvering through airport security when travelling    Baseline Significantly decreased overhead ROM    Time 8    Period Weeks    Status On-going      OT LONG TERM GOAL #4   Title Pt will be independent with HEP (including wear and care of orthosis if needed) designed to facilitate balance, improved positioning of LUE, and prevent further joint contracture    Time 8    Period Weeks    Status On-going                   Plan - 12/31/20 1312     Clinical Impression Statement Pt unable to attend last two weeks due to planned vacation - cruise.    OT Occupational Profile and History Detailed Assessment- Review of Records and additional review of physical, cognitive, psychosocial history related to current functional performance    Occupational performance deficits (Please refer to evaluation for details): ADL's;IADL's;Leisure;Social Participation    Body Structure / Function / Physical Skills ADL;ROM;UE functional use;Balance;Decreased knowledge of use of DME;FMC;GMC;Gait;Strength;Coordination;IADL;Tone;Mobility;Vision;Body mechanics;Dexterity;Endurance;Flexibility    Rehab Potential Fair    Clinical Decision Making Several treatment options, min-mod  task modification necessary    Comorbidities Affecting Occupational Performance: Presence of comorbidities impacting occupational performance    Modification or Assistance to Complete Evaluation  Min-Moderate modification of tasks or assist with assess necessary to complete eval    OT Frequency 1x / week    OT  Duration 8 weeks    OT Treatment/Interventions Self-care/ADL training;Therapeutic exercise;Neuromuscular education;Patient/family education;Splinting;Energy conservation;Therapeutic activities;Balance training;DME and/or AE instruction;Manual Therapy;Passive range of motion;Visual/perceptual remediation/compensation;Aquatic Therapy;Functional Mobility Training    Plan aquatic therapy to facilitate UE ROM and balance    OT Home Exercise Plan Patient able to address balance while standing at waist high or above water with RUE on wall for support, or hovering above wall    Consulted and Agree with Plan of Care Patient             Patient will benefit from skilled therapeutic intervention in order to improve the following deficits and impairments:   Body Structure / Function / Physical Skills: ADL, ROM, UE functional use, Balance, Decreased knowledge of use of DME, FMC, GMC, Gait, Strength, Coordination, IADL, Tone, Mobility, Vision, Body mechanics, Dexterity, Endurance, Flexibility       Visit Diagnosis: Stiffness of left elbow, not elsewhere classified  Stiffness of left hand, not elsewhere classified  Other lack of coordination  Muscle weakness (generalized)  Unsteadiness on feet    Problem List Patient Active Problem List   Diagnosis Date Noted   Acute pain of left shoulder 10/13/2017   Essential hypertension 10/12/2017   Elevated cholesterol with elevated triglycerides 10/12/2017   Controlled type 2 diabetes mellitus without complication, without long-term current use of insulin ([place]) 10/12/2017   Stenosis of left carotid artery 10/12/2017   Left arm pain 10/12/2017    Glaucoma 10/12/2017   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 [place], [place], [address] Phone: [phone]   Fax:  [phone]  Name: Rexx Mccarson MRN: [id number] Date of Birth: [birth year]

## 2021-10-22 ENCOUNTER — Telehealth: Payer: Self-pay

## 2021-10-22 NOTE — Telephone Encounter (Signed)
Attempted to contact patient/patient's spouse Nathaniel Proctor on both home and mobile to schedule a Palliative Care consult appointment. No answer and unable to leave a voicemail.

## 2021-10-31 ENCOUNTER — Telehealth: Payer: Self-pay

## 2021-10-31 NOTE — Telephone Encounter (Signed)
Attempted to contact patient and patient's spouse on both mobile and home numbers to schedule a Palliative Care consult appointment. No answer or voicemail setup.

## 2021-11-11 ENCOUNTER — Telehealth: Payer: Self-pay

## 2021-11-11 NOTE — Telephone Encounter (Signed)
Attempted to contact patient/patient's spouse Bonnita Nasuti to schedule a Palliative Care consult appointment. No answer or voicemail on home or mobile. If no response by 6/15 will cancel referral.

## 2022-01-25 ENCOUNTER — Encounter (HOSPITAL_BASED_OUTPATIENT_CLINIC_OR_DEPARTMENT_OTHER): Payer: Self-pay | Admitting: Emergency Medicine

## 2022-01-25 ENCOUNTER — Other Ambulatory Visit: Payer: Self-pay

## 2022-01-25 ENCOUNTER — Emergency Department (HOSPITAL_BASED_OUTPATIENT_CLINIC_OR_DEPARTMENT_OTHER)
Admission: EM | Admit: 2022-01-25 | Discharge: 2022-01-25 | Disposition: A | Discharge status: Home / Self Care | Payer: Medicare Other | Attending: Emergency Medicine | Admitting: Emergency Medicine

## 2022-01-25 ENCOUNTER — Emergency Department (HOSPITAL_BASED_OUTPATIENT_CLINIC_OR_DEPARTMENT_OTHER): Payer: Medicare Other

## 2022-01-25 DIAGNOSIS — W19XXXA Unspecified fall, initial encounter: Secondary | ICD-10-CM

## 2022-01-25 DIAGNOSIS — Z20822 Contact with and (suspected) exposure to covid-19: Secondary | ICD-10-CM | POA: Diagnosis not present

## 2022-01-25 DIAGNOSIS — Z79899 Other long term (current) drug therapy: Secondary | ICD-10-CM | POA: Diagnosis not present

## 2022-01-25 DIAGNOSIS — K5730 Diverticulosis of large intestine without perforation or abscess without bleeding: Secondary | ICD-10-CM | POA: Insufficient documentation

## 2022-01-25 DIAGNOSIS — I10 Essential (primary) hypertension: Secondary | ICD-10-CM | POA: Diagnosis not present

## 2022-01-25 DIAGNOSIS — G3185 Corticobasal degeneration: Secondary | ICD-10-CM | POA: Diagnosis not present

## 2022-01-25 DIAGNOSIS — Z85118 Personal history of other malignant neoplasm of bronchus and lung: Secondary | ICD-10-CM | POA: Diagnosis not present

## 2022-01-25 DIAGNOSIS — S0101XA Laceration without foreign body of scalp, initial encounter: Secondary | ICD-10-CM | POA: Diagnosis not present

## 2022-01-25 DIAGNOSIS — W01198A Fall on same level from slipping, tripping and stumbling with subsequent striking against other object, initial encounter: Secondary | ICD-10-CM | POA: Insufficient documentation

## 2022-01-25 DIAGNOSIS — S0990XA Unspecified injury of head, initial encounter: Secondary | ICD-10-CM | POA: Diagnosis present

## 2022-01-25 LAB — COMPREHENSIVE METABOLIC PANEL
ALT: 17 U/L (ref 0–44)
AST: 26 U/L (ref 15–41)
Albumin: 3.8 g/dL (ref 3.5–5.0)
Alkaline Phosphatase: 82 U/L (ref 38–126)
Anion gap: 8 (ref 5–15)
BUN: 12 mg/dL (ref 8–23)
CO2: 27 mmol/L (ref 22–32)
Calcium: 8.8 mg/dL — ABNORMAL LOW (ref 8.9–10.3)
Chloride: 104 mmol/L (ref 98–111)
Creatinine, Ser: 0.8 mg/dL (ref 0.61–1.24)
GFR, Estimated: >60 mL/min (ref >60)
Glucose, Bld: 139 mg/dL — ABNORMAL HIGH (ref 70–99)
Potassium: 3.7 mmol/L (ref 3.5–5.1)
Sodium: 139 mmol/L (ref 135–145)
Total Bilirubin: 1 mg/dL (ref 0.3–1.2)
Total Protein: 6.8 g/dL (ref 6.5–8.1)

## 2022-01-25 LAB — URINALYSIS, ROUTINE W REFLEX MICROSCOPIC
Bilirubin Urine: NEGATIVE
Glucose, UA: NEGATIVE mg/dL
Hgb urine dipstick: NEGATIVE
Ketones, ur: NEGATIVE mg/dL
Leukocytes,Ua: NEGATIVE
Nitrite: NEGATIVE
Protein, ur: NEGATIVE mg/dL
Specific Gravity, Urine: <=1.005 (ref 1.005–1.030)
pH: 6 (ref 5.0–8.0)

## 2022-01-25 LAB — CBC WITH DIFFERENTIAL/PLATELET
Abs Immature Granulocytes: 0.03 10*3/uL (ref 0.00–0.07)
Basophils Absolute: 0 10*3/uL (ref 0.0–0.1)
Basophils Relative: 0 %
Eosinophils Absolute: 0 10*3/uL (ref 0.0–0.5)
Eosinophils Relative: 1 %
HCT: 38 % — ABNORMAL LOW (ref 39.0–52.0)
Hemoglobin: 13.3 g/dL (ref 13.0–17.0)
Immature Granulocytes: 0 %
Lymphocytes Relative: 10 %
Lymphs Abs: 0.7 10*3/uL (ref 0.7–4.0)
MCH: 32.8 pg (ref 26.0–34.0)
MCHC: 35 g/dL (ref 30.0–36.0)
MCV: 93.8 fL (ref 80.0–100.0)
Monocytes Absolute: 0.6 10*3/uL (ref 0.1–1.0)
Monocytes Relative: 8 %
Neutro Abs: 5.3 10*3/uL (ref 1.7–7.7)
Neutrophils Relative %: 81 %
Platelets: 168 10*3/uL (ref 150–400)
RBC: 4.05 MIL/uL — ABNORMAL LOW (ref 4.22–5.81)
RDW: 12.8 % (ref 11.5–15.5)
WBC: 6.7 10*3/uL (ref 4.0–10.5)
nRBC: 0 % (ref 0.0–0.2)

## 2022-01-25 LAB — CBG MONITORING, ED: Glucose-Capillary: 163 mg/dL — ABNORMAL HIGH (ref 70–99)

## 2022-01-25 LAB — SARS CORONAVIRUS 2 BY RT PCR: SARS Coronavirus 2 by RT PCR: NEGATIVE

## 2022-01-25 MED ORDER — LIDOCAINE-EPINEPHRINE (PF) 2 %-1:200000 IJ SOLN
10.0000 mL | Freq: Once | INTRAMUSCULAR | Status: AC
  Administered 2022-01-25: 10 mL
  Filled 2022-01-25: qty 20

## 2022-01-25 MED ORDER — IOHEXOL 300 MG/ML  SOLN
100.0000 mL | Freq: Once | INTRAMUSCULAR | Status: AC | PRN
  Administered 2022-01-25: 100 mL via INTRAVENOUS

## 2022-01-25 MED ORDER — SODIUM CHLORIDE 0.9 % IV BOLUS
1000.0000 mL | Freq: Once | INTRAVENOUS | Status: AC
  Administered 2022-01-25: 1000 mL via INTRAVENOUS

## 2022-01-25 MED ORDER — INVIEW EXTRA EXTERNAL 29MM MISC: 0 refills | Status: DC

## 2022-01-25 MED ORDER — DOXYCYCLINE HYCLATE 100 MG PO CAPS: 100.0000 mg | ORAL_CAPSULE | Freq: Two times a day (BID) | ORAL | 0 refills | Status: DC

## 2022-01-25 NOTE — ED Triage Notes (Signed)
Patient fell and hit his head on a bench@ 330am then fell again at 830 this morning States patient has fell 17 times since last Friday .

## 2022-01-25 NOTE — ED Notes (Signed)
Meal Provided

## 2022-01-25 NOTE — ED Notes (Signed)
Patient transported to CT 

## 2022-01-25 NOTE — ED Notes (Signed)
Pt stood just beside the bed via X1 MAX assist to use urinal. After voiding, pt became dizzy and quickly lowered back onto locked stretcher X1MAX assist.

## 2022-01-25 NOTE — ED Notes (Signed)
ED Provider at bedside. 

## 2022-01-25 NOTE — ED Provider Notes (Signed)
[facility] EMERGENCY DEPARTMENT Provider Note   CSN: [id number] Arrival date & time: 01/25/22  1046     History  Chief Complaint  Patient presents with   Nathaniel Proctor is a 66 y.o. male.  Pt is a 66 yo male with a pmhx significant for HTN, HLD, lung cancer, glaucoma, sleep apnea, and corticobasal degeneration.  Pt has been followed at [facility] for this problem and is scheduled to see his neurologist again in early September.  Pt has had contractures and weakness in his left arm for a few years.  The pt has also noticed that his leg is not working correctly.  The pt is supposed to use a motorized wheelchair.  Pt fell yesterday and went to the ED in [place].  The family and he got home early this am around 64.  He tried to get up by himself around 0330 and fell back, hitting his head.  No loc.  He did sustain a lac to the back of his head.  He is not on blood thinners.  Family gave him ibuprofen pta and pain is better.  Pt has fallen 17 times in the past week.  Wife said they do have a consult out for hospice.  He is not getting any tx for his lung cancer currently.  He still has a port.  Unfortunately, the pt's home health dropped patient since he will be getting hospice.  The family does not have any help at home.         Home Medications Prior to Admission medications   Medication Sig Start Date End Date Taking? Authorizing Provider  Catheters (INVIEW EXTRA EXTERNAL 29MM) MISC Change every 24-48 hours. 01/25/22  Yes Isla Pence, MD  doxycycline (VIBRAMYCIN) 100 MG capsule Take 1 capsule (100 mg total) by mouth 2 (two) times daily. 01/25/22  Yes Isla Pence, MD  amLODipine-benazepril (LOTREL) 10-20 MG capsule Take 1 capsule by mouth daily. 08/17/18 11/25/19  Libby Maw, MD  amoxicillin-clavulanate (AUGMENTIN) 875-125 MG tablet Take 1 tablet by mouth 2 (two) times daily.    [provider]  aspirin 325 MG tablet Take 1 tablet (325 mg total)  by mouth daily. 08/17/18   Libby Maw, MD  atorvastatin (LIPITOR) 20 MG tablet Take 1 tablet (20 mg total) by mouth daily. 08/17/18   Libby Maw, MD  carbidopa-levodopa (SINEMET IR) 25-100 MG tablet Take 1 tablet by mouth 3 (three) times daily. 09/21/19   [provider]  clobetasol cream (TEMOVATE) 0.05 % Apply topically. Patient not taking: Reported on 11/25/2019    [provider]  Diclofenac Sodium (PENNSAID) 2 % SOLN Place 1 application onto the skin 2 (two) times daily. Patient not taking: Reported on 11/25/2019 10/13/17   Rosemarie Ax, MD  fluticasone [facility]) 50 MCG/ACT nasal spray 2 sprays daily.    [provider]  latanoprost (XALATAN) 0.005 % ophthalmic solution Place 1 drop into both eyes at bedtime.    [provider]  metFORMIN (GLUCOPHAGE-XR) 500 MG 24 hr tablet Take 1 tablet (500 mg total) by mouth at bedtime. 08/17/18   Libby Maw, MD  metoprolol succinate (TOPROL-XL) 100 MG 24 hr tablet Take 1 tablet (100 mg total) by mouth daily. Take with or immediately following a meal. 08/17/18 11/07/19  Libby Maw, MD  metoprolol tartrate (LOPRESSOR) 100 MG tablet Take 100 mg by mouth daily. 08/17/19   [provider]      Allergies  Patient has no known allergies.    Review of Systems   Review of Systems  Skin:  Positive for wound.  Neurological:  Positive for headaches.  All other systems reviewed and are negative.   Physical Exam Updated Vital Signs BP 137/80   Pulse 65   Temp 97.7 F (36.5 C) (Oral)   Resp 20   SpO2 93%  Physical Exam Vitals and nursing note reviewed.  Constitutional:      Appearance: Normal appearance.  HENT:     Head: Normocephalic.     Comments: Lac to posterior scalp    Right Ear: External ear normal.     Left Ear: External ear normal.     Nose: Nose normal.     Mouth/Throat:     Mouth: Mucous membranes are moist.     Pharynx: Oropharynx is clear.   Eyes:     Extraocular Movements: Extraocular movements intact.     Conjunctiva/sclera: Conjunctivae normal.     Pupils: Pupils are equal, round, and reactive to light.  Cardiovascular:     Rate and Rhythm: Normal rate and regular rhythm.     Pulses: Normal pulses.     Heart sounds: Normal heart sounds.  Pulmonary:     Effort: Pulmonary effort is normal.     Breath sounds: Normal breath sounds.  Abdominal:     General: Abdomen is flat. Bowel sounds are normal.     Palpations: Abdomen is soft.  Musculoskeletal:        General: Normal range of motion.     Cervical back: Normal range of motion and neck supple.  Skin:    General: Skin is warm.     Capillary Refill: Capillary refill takes less than 2 seconds.  Neurological:     Mental Status: He is alert and oriented to person, place, and time.     Comments: LUE with significant contracture (chronic)  LLE weak  Psychiatric:        Mood and Affect: Mood normal.        Behavior: Behavior normal.     ED Results / Procedures / Treatments   Labs (all labs ordered are listed, but only abnormal results are displayed) Labs Reviewed  CBC WITH DIFFERENTIAL/PLATELET - Abnormal; Notable for the following components:      Result Value   RBC 4.05 (*)    HCT 38.0 (*)    All other components within normal limits  COMPREHENSIVE METABOLIC PANEL - Abnormal; Notable for the following components:   Glucose, Bld 139 (*)    Calcium 8.8 (*)    All other components within normal limits  CBG MONITORING, ED - Abnormal; Notable for the following components:   Glucose-Capillary 163 (*)    All other components within normal limits  SARS CORONAVIRUS 2 BY RT PCR  URINALYSIS, ROUTINE W REFLEX MICROSCOPIC    EKG EKG Interpretation  Date/Time:  Saturday January 25 2022 11:27:53 EDT Ventricular Rate:  60 PR Interval:  240 QRS Duration: 105 QT Interval:  452 QTC Calculation: 452 R Axis:   78 Text Interpretation: Sinus rhythm Prolonged PR interval  Borderline T wave abnormalities No old tracing to compare Confirmed by Isla Pence [phone]) on 01/25/2022 12:04:05 PM  Radiology CT Head Wo Contrast  Result Date: 01/25/2022 CLINICAL DATA:  Multiple falls in past 2 days.  Blunt head trauma. EXAM: CT HEAD WITHOUT CONTRAST TECHNIQUE: Contiguous axial images were obtained from the base of the skull through the vertex without intravenous contrast. RADIATION DOSE  REDUCTION: This exam was performed according to the departmental dose-optimization program which includes automated exposure control, adjustment of the mA and/or kV according to patient size and/or use of iterative reconstruction technique. COMPARISON:  06/08/2017 FINDINGS: Brain: No evidence of intracranial hemorrhage, acute infarction, hydrocephalus, extra-axial collection, or mass lesion/mass effect. Moderate cerebral atrophy and mild chronic small vessel disease show interval progression since prior study. Vascular:  No hyperdense vessel or other acute findings. Skull: No evidence of fracture or other significant bone abnormality. Sinuses/Orbits:  No acute findings. Other: None. IMPRESSION: No acute intracranial abnormality. Interval progression of diffuse cerebral atrophy and chronic small vessel disease. Electronically Signed   By: Marlaine Hind M.D.   On: 01/25/2022 14:16   CT Cervical Spine Wo Contrast  Result Date: 01/25/2022 CLINICAL DATA:  Hit head on bench this morning. Neck trauma and pain. EXAM: CT CERVICAL SPINE WITHOUT CONTRAST TECHNIQUE: Multidetector CT imaging of the cervical spine was performed without intravenous contrast. Multiplanar CT image reconstructions were also generated. RADIATION DOSE REDUCTION: This exam was performed according to the departmental dose-optimization program which includes automated exposure control, adjustment of the mA and/or kV according to patient size and/or use of iterative reconstruction technique. COMPARISON:  None Available. FINDINGS: Alignment:  Normal. Skull base and vertebrae: No acute fracture. No primary bone lesion or focal pathologic process. Soft tissues and spinal canal: No prevertebral fluid or swelling. No visible canal hematoma. Disc levels: Mild degenerative disc disease is seen at C4-5 and C5-6, and moderate degenerative disc disease is seen at C6-7. Left-sided uncovertebral spurring is seen at C6-7. No significant facet arthropathy identified. Mild cervical kyphosis atlantoaxial degenerative changes are also seen. Upper chest: No acute findings. Other: None. IMPRESSION: No evidence of cervical spine fracture or subluxation. Degenerative cervical spondylosis and kyphosis, as described above. Electronically Signed   By: Marlaine Hind M.D.   On: 01/25/2022 14:06   CT CHEST ABDOMEN PELVIS W CONTRAST  Result Date: 01/25/2022 CLINICAL DATA:  Multiple falls in past 2 days. Blunt polytrauma. Chest and abdominal pain. EXAM: CT CHEST, ABDOMEN, AND PELVIS WITH CONTRAST TECHNIQUE: Multidetector CT imaging of the chest, abdomen and pelvis was performed following the standard protocol during bolus administration of intravenous contrast. RADIATION DOSE REDUCTION: This exam was performed according to the departmental dose-optimization program which includes automated exposure control, adjustment of the mA and/or kV according to patient size and/or use of iterative reconstruction technique. CONTRAST:  160m OMNIPAQUE IOHEXOL 300 MG/ML  SOLN COMPARISON:  None Available. FINDINGS: CT CHEST FINDINGS Cardiovascular: No evidence of thoracic aortic injury or mediastinal hematoma. No pericardial effusion. Mediastinum/Nodes: No evidence of hemorrhage or pneumomediastinum. No masses or pathologically enlarged lymph nodes identified. Lungs/Pleura: Atelectasis or scarring is seen in the left paramediastinal lung zones and the anterior-inferior right upper lobe. Multifocal ground-glass opacities are seen in the anterior left upper lobe and lateral right upper lobe  which are nonspecific and may be due to pulmonary contusion or inflammatory or infectious etiologies. No evidence of pneumothorax or pleural effusion. Musculoskeletal: No acute fractures or suspicious bone lesions identified. CT ABDOMEN PELVIS FINDINGS Hepatobiliary: No hepatic laceration or mass identified. Gallbladder is unremarkable. No evidence of biliary ductal dilatation. Pancreas: No parenchymal laceration, mass, or inflammatory changes identified. Spleen: No evidence of splenic laceration. Adrenal/Urinary Tract: No hemorrhage or parenchymal lacerations identified. No evidence of suspicious masses or hydronephrosis. Unremarkable unopacified urinary bladder. Stomach/Bowel: Unopacified bowel loops are unremarkable in appearance. No evidence of hemoperitoneum. Diverticulosis is seen mainly involving the descending and sigmoid  colon, however there is no evidence of diverticulitis. Normal appendix visualized. Vascular/Lymphatic: No evidence of abdominal aortic injury or retroperitoneal hemorrhage. Aortic atherosclerotic calcification incidentally noted. No pathologically enlarged lymph nodes identified. Reproductive:  No mass or other significant abnormality identified. Other:  None. Musculoskeletal: No acute fractures or suspicious bone lesions identified. IMPRESSION: Multifocal ground-glass opacities in both upper lobes, left side greater than right, which are nonspecific. Differential diagnosis includes pulmonary contusion as well as inflammatory and infectious etiologies. Recommend continued follow-up by chest CT. No other traumatic injuries identified. Atelectasis or scarring in the left paramediastinal lung zone and inferior right upper lobe. Colonic diverticulosis, without radiographic evidence of diverticulitis. Aortic Atherosclerosis (ICD10-I70.0). Electronically Signed   By: Marlaine Hind M.D.   On: 01/25/2022 14:01    Procedures .Marland KitchenLaceration Repair  Date/Time: 01/25/2022 1:03 PM  Performed by:  Isla Pence, MD Authorized by: Isla Pence, MD   Consent:    Consent obtained:  Verbal   Consent given by:  Patient Universal protocol:    Patient identity confirmed:  Verbally with patient Anesthesia:    Anesthesia method:  Local infiltration   Local anesthetic:  Lidocaine 2% WITH epi Laceration details:    Location:  Scalp   Scalp location:  Crown   Length (cm):  3 Pre-procedure details:    Preparation:  Patient was prepped and draped in usual sterile fashion Treatment:    Area cleansed with:  Saline   Amount of cleaning:  Standard   Irrigation method:  Pressure wash Skin repair:    Repair method:  Staples   Number of staples:  5 Approximation:    Approximation:  Close Repair type:    Repair type:  Simple Post-procedure details:    Dressing:  Open (no dressing)   Procedure completion:  Tolerated well, no immediate complications     Medications Ordered in ED Medications  sodium chloride 0.9 % bolus 1,000 mL (0 mLs Intravenous Stopped 01/25/22 1517)  lidocaine-EPINEPHrine (XYLOCAINE W/EPI) 2 %-1:200000 (PF) injection 10 mL (10 mLs Infiltration Given 01/25/22 1218)  iohexol (OMNIPAQUE) 300 MG/ML solution 100 mL (100 mLs Intravenous Contrast Given 01/25/22 1341)    ED Course/ Medical Decision Making/ A&P                           Medical Decision Making Amount and/or Complexity of Data Reviewed Labs: ordered. Radiology: ordered.  Risk Prescription drug management.   This patient presents to the ED for concern of fall, this involves an extensive number of treatment options, and is a complaint that carries with it a high risk of complications and morbidity.  The differential diagnosis includes multiple trauma, infection   Co morbidities that complicate the patient evaluation  HTN, HLD, lung cancer, glaucoma, sleep apnea, and corticobasal degeneration.   Additional history obtained:  Additional history obtained from epic chart review External records  from outside source obtained and reviewed including family   Lab Tests:  I Ordered, and personally interpreted labs.  The pertinent results include:  cbc nl, cmp nl, ua nl   Imaging Studies ordered:  I ordered imaging studies including ct head/c-spine, chest/abd/pelvis  I independently visualized and interpreted imaging which showed  CT head: IMPRESSION:  No acute intracranial abnormality.    Interval progression of diffuse cerebral atrophy and chronic small  vessel disease.  CT cervical spine: IMPRESSION:  No evidence of cervical spine fracture or subluxation.    Degenerative cervical spondylosis and kyphosis, as described above.  CT chest/abd/pelvis: IMPRESSION:  Multifocal ground-glass opacities in both upper lobes, left side  greater than right, which are nonspecific. Differential diagnosis  includes pulmonary contusion as well as inflammatory and infectious  etiologies. Recommend continued follow-up by chest CT.    No other traumatic injuries identified.    Atelectasis or scarring in the left paramediastinal lung zone and  inferior right upper lobe.    Colonic diverticulosis, without radiographic evidence of  diverticulitis.    Aortic Atherosclerosis (ICD10-I70.0).   I agree with the radiologist interpretation   Cardiac Monitoring:  The patient was maintained on a cardiac monitor.  I personally viewed and interpreted the cardiac monitored which showed an underlying rhythm of: nsr   Medicines ordered and prescription drug management:  I have reviewed the patients home medicines and have made adjustments as needed   Test Considered:  ct   Critical Interventions:  Condom cath   Consultations Obtained:  I requested consultation with hospice,  and discussed lab and imaging findings as well as pertinent plan - they will see pt.   Problem List / ED Course:  Ambulatory dysfunction:  pt has a lot of trouble when he needs to urinate.  Family requested  that we place a condom cath.  This is done.  Unfortunately, pt's condition is progressive and not something I can fix.  Consult for SW for home health placed.  Pt also asked that I call hospice as they have been calling without any call back. CT scan with likely pulmonary contusions vs infection:  pt has had a cough, so I will put him on doxy.  Covid is neg Lac to scalp:  stapled.  Pt will need to return in 10 days for suture removal.   Reevaluation:  After the interventions noted above, I reevaluated the patient and found that they have :stayed the same   Social Determinants of Health:  Lives at home   Dispostion:  After consideration of the diagnostic results and the patients response to treatment, I feel that the patent would benefit from discharge with outpatient f/u.          Final Clinical Impression(s) / ED Diagnoses Final diagnoses:  Fall, initial encounter  Laceration of scalp, initial encounter  Corticobasal degeneration ([place])    Rx / DC Orders ED Discharge Orders          Ordered    doxycycline (VIBRAMYCIN) 100 MG capsule  2 times daily        01/25/22 1514    Catheters (INVIEW EXTRA EXTERNAL 29MM) MISC        01/25/22 1523              Isla Pence, MD 01/25/22 1529

## 2022-01-25 NOTE — ED Notes (Signed)
Patient is waitng on the hospice nurse

## 2022-01-25 NOTE — ED Notes (Addendum)
rn tried to catherized patient  no urine was present . Plan to hydrate patient and try again

## 2022-01-25 NOTE — ED Notes (Signed)
Head is cleaned

## 2022-01-25 NOTE — ED Notes (Signed)
Awaiting further contact with Hospice RN

## 2022-01-25 NOTE — ED Notes (Addendum)
[place] from the Ed states that [facility] will call the patients wife today or later tomorrow.Notified patient wife. Also gave the wife  a phone number to call if no one contacts them per [employer] [phone]

## 2022-01-25 NOTE — ED Notes (Signed)
Patient is alert x4 denies any visual changes .

## 2023-01-01 DEATH — deceased
